# Patient Record
Sex: Male | Born: 1990 | Race: White | Hispanic: No | Marital: Married | Smoking: Never smoker
Health system: Southern US, Community
[De-identification: ages and names within clinical notes are randomized; demographics above are authoritative.]

## PROBLEM LIST (undated history)

## (undated) DIAGNOSIS — N63 Unspecified lump in unspecified breast: Secondary | ICD-10-CM

## (undated) DIAGNOSIS — J3089 Other allergic rhinitis: Secondary | ICD-10-CM

## (undated) DIAGNOSIS — K219 Gastro-esophageal reflux disease without esophagitis: Secondary | ICD-10-CM

## (undated) HISTORY — PX: TONSILLECTOMY: SUR1361

## (undated) HISTORY — DX: Gastro-esophageal reflux disease without esophagitis: K21.9

---

## 2013-06-29 DIAGNOSIS — S62308A Unspecified fracture of other metacarpal bone, initial encounter for closed fracture: Secondary | ICD-10-CM | POA: Insufficient documentation

## 2015-01-21 ENCOUNTER — Ambulatory Visit
Admission: RE | Admit: 2015-01-21 | Discharge: 2015-01-21 | Disposition: A | Discharge status: Home / Self Care | Payer: BLUE CROSS/BLUE SHIELD | Source: Ambulatory Visit | Attending: Physician Assistant | Admitting: Physician Assistant

## 2015-01-21 ENCOUNTER — Other Ambulatory Visit: Payer: Self-pay | Admitting: Physician Assistant

## 2015-01-21 DIAGNOSIS — S67192A Crushing injury of right middle finger, initial encounter: Secondary | ICD-10-CM | POA: Diagnosis present

## 2015-01-21 DIAGNOSIS — X58XXXA Exposure to other specified factors, initial encounter: Secondary | ICD-10-CM | POA: Diagnosis not present

## 2015-01-21 DIAGNOSIS — S62632A Displaced fracture of distal phalanx of right middle finger, initial encounter for closed fracture: Secondary | ICD-10-CM | POA: Insufficient documentation

## 2015-05-15 ENCOUNTER — Other Ambulatory Visit: Payer: Self-pay | Admitting: Surgery

## 2015-05-15 DIAGNOSIS — N63 Unspecified lump in unspecified breast: Secondary | ICD-10-CM

## 2015-05-21 ENCOUNTER — Ambulatory Visit
Admission: RE | Admit: 2015-05-21 | Discharge: 2015-05-21 | Disposition: A | Discharge status: Home / Self Care | Payer: BLUE CROSS/BLUE SHIELD | Source: Ambulatory Visit | Attending: Surgery | Admitting: Surgery

## 2015-05-21 ENCOUNTER — Other Ambulatory Visit: Payer: Self-pay | Admitting: Surgery

## 2015-05-21 ENCOUNTER — Ambulatory Visit (HOSPITAL_COMMUNITY)
Admission: RE | Admit: 2015-05-21 | Discharge: 2015-05-21 | Disposition: A | Discharge status: Home / Self Care | Payer: BLUE CROSS/BLUE SHIELD | Source: Ambulatory Visit | Attending: Surgery | Admitting: Surgery

## 2015-05-21 DIAGNOSIS — Z807 Family history of other malignant neoplasms of lymphoid, hematopoietic and related tissues: Secondary | ICD-10-CM | POA: Insufficient documentation

## 2015-05-21 DIAGNOSIS — N63 Unspecified lump in unspecified breast: Secondary | ICD-10-CM

## 2015-05-21 DIAGNOSIS — I889 Nonspecific lymphadenitis, unspecified: Secondary | ICD-10-CM | POA: Diagnosis not present

## 2015-05-21 HISTORY — PX: BREAST BIOPSY: SHX20

## 2015-05-21 HISTORY — DX: Unspecified lump in unspecified breast: N63.0

## 2015-05-23 LAB — SURGICAL PATHOLOGY

## 2015-05-27 ENCOUNTER — Other Ambulatory Visit: Payer: Self-pay

## 2015-05-27 ENCOUNTER — Ambulatory Visit: Payer: BLUE CROSS/BLUE SHIELD

## 2015-07-26 ENCOUNTER — Encounter: Payer: Self-pay | Admitting: Emergency Medicine

## 2015-07-26 ENCOUNTER — Ambulatory Visit
Admission: EM | Admit: 2015-07-26 | Discharge: 2015-07-26 | Disposition: A | Discharge status: Home / Self Care | Payer: Worker's Compensation | Attending: Family Medicine | Admitting: Family Medicine

## 2015-07-26 ENCOUNTER — Ambulatory Visit (INDEPENDENT_AMBULATORY_CARE_PROVIDER_SITE_OTHER): Payer: Worker's Compensation

## 2015-07-26 ENCOUNTER — Ambulatory Visit
Admit: 2015-07-26 | Discharge: 2015-07-26 | Disposition: A | Discharge status: Home / Self Care | Payer: Worker's Compensation | Attending: Family Medicine | Admitting: Family Medicine

## 2015-07-26 DIAGNOSIS — S51811D Laceration without foreign body of right forearm, subsequent encounter: Secondary | ICD-10-CM | POA: Diagnosis not present

## 2015-07-26 DIAGNOSIS — S51811A Laceration without foreign body of right forearm, initial encounter: Secondary | ICD-10-CM | POA: Diagnosis not present

## 2015-07-26 DIAGNOSIS — M795 Residual foreign body in soft tissue: Secondary | ICD-10-CM | POA: Diagnosis not present

## 2015-07-26 MED ORDER — CEPHALEXIN 500 MG PO CAPS: 500.0000 mg | ORAL_CAPSULE | Freq: Four times a day (QID) | ORAL | Status: AC

## 2015-07-26 MED ORDER — TETANUS-DIPHTH-ACELL PERTUSSIS 5-2.5-18.5 LF-MCG/0.5 IM SUSP
0.5000 mL | Freq: Once | INTRAMUSCULAR | Status: AC
  Administered 2015-07-26: 0.5 mL via INTRAMUSCULAR

## 2015-07-26 NOTE — ED Notes (Signed)
Pt presents with right arm laceration, cut on come glass today at work. Dressing in place bleeding controlled. Cms intact.

## 2015-07-26 NOTE — ED Notes (Signed)
Ct scan approved verbally by Norval MortonEsther Bennett, HR Dir. At [facility] [place]. CT scheduled for 430 today.

## 2015-07-26 NOTE — ED Provider Notes (Signed)
[facility]  ____________________________________________  Time seen: Approximately 2:56 PM  I have reviewed the triage vital signs and the nursing notes.   HISTORY  Chief Complaint Extremity Laceration   HPI Roberto Barnes is a 25 y.o. male presents with complaint of laceration to right forearm sustained approximately 2 hours prior to arrival. 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 Patient states that his coworker that was seen to find him had lifted a sheet of glass and that the wind blew, call the glass and cause it to fall. Patient reports that as the glass fell and hit against his right forearm on the way down and caused a laceration. Patient states that he has very mild pain directly at laceration site. Denies other pain. Denies numbness or tingling sensation. Denies decreased range of motion.  Patient reports is right-hand dominant. Denies fall to the ground. Denies head injury or loss consciousness. Denies other pain or injury. Patient reports that he thinks his tetanus immunization is up-to-date but states that he will discuss this with his wife.   Past Medical History  Diagnosis Date  . Breast mass     left breast mass x1 month    Patient Active Problem List   Diagnosis Date Noted  . Foreign body (FB) in soft tissue 07/26/2015    Past Surgical History  Procedure Laterality Date  . Breast biopsy Left 05/21/2015    path pending    Current Outpatient Rx  Name  Route  Sig  Dispense  Refill  . cephALEXin (KEFLEX) 500 MG capsule   Oral   Take 1 capsule (500 mg total) by mouth 4 (four) times daily.   28 capsule   0     Allergies Review of patient's allergies indicates no known allergies.  No family history on file.  Social History Social History  Substance Use Topics  . Smoking status: Never Smoker   . Smokeless tobacco: None  . Alcohol Use: Yes    Review of  Systems Constitutional: No fever/chills Eyes: No visual changes. ENT: No sore throat. Cardiovascular: Denies chest pain. Respiratory: Denies shortness of breath. Gastrointestinal: No abdominal pain.  No nausea, no vomiting.  No diarrhea.  No constipation. Genitourinary: Negative for dysuria. Musculoskeletal: Negative for back pain. Skin: Negative for rash.Right forearm laceration. Neurological: Negative for headaches, focal weakness or numbness.  10-point ROS otherwise negative.  ____________________________________________   PHYSICAL EXAM:  VITAL SIGNS: ED Triage Vitals  Enc Vitals Group     BP 07/26/15 1226 147/108 mmHg     Pulse Rate 07/26/15 1226 93     Resp 07/26/15 1226 18     Temp 07/26/15 1226 97.8 F (36.6 C)     Temp Source 07/26/15 1226 Oral     SpO2 07/26/15 1226 98 %     Weight 07/26/15 1226 340 lb (154.223 kg)     Height 07/26/15 1226  (1.753 m)     Head Cir --      Peak Flow --      Pain Score 07/26/15 1229 1     Pain Loc --      Pain Edu? --      Excl. in GC? --    Today's Vitals   07/26/15 1226 07/26/15 1229 07/26/15 1836  BP: 147/108  135/99  Pulse: 93  74  Temp: 97.8 F (36.6 C)    TempSrc: Oral  Resp: 18  20  Height:  (1.753 m)    Weight: 340 lb (154.223 kg)    SpO2: 98%  100%  PainSc:  1       Constitutional: Alert and oriented. Well appearing and in no acute distress. Eyes: Conjunctivae are normal. PERRL. EOMI. Head: Atraumatic.   Nose: No congestion/rhinnorhea.  Mouth/Throat: Mucous membranes are moist.   Neck: No stridor.  No cervical spine tenderness to palpation. Hematological/Lymphatic/Immunilogical: No cervical lymphadenopathy. Cardiovascular: Normal rate, regular rhythm. Grossly normal heart sounds.  Good peripheral circulation. Respiratory: Normal respiratory effort.  No retractions. Lungs CTAB. Gastrointestinal: Soft and nontender. Musculoskeletal: No lower or upper extremity tenderness nor edema. Bilateral hand  grips strong and equal. Bilateral distal radial pulses equal and easily palpable. Right hand capillary refill less than 2 seconds to all right and distal fingers. No motor or tendon deficits to right hand or right arm. Full range of motion to right upper extremity. No pain to right arm with resisted wrist extension, mild pain at laceration site with resisted wrist flexion.Sensation intact to bilateral upper extremities.  Neurologic:  Normal speech and language. No gross focal neurologic deficits are appreciated. No gait instability. Skin:  Skin is warm, dry and intact. No rash noted. Except: 6cm linear laceration to the lower aspect of right forearm mid to distal third of forearm,no arterial bleed, no foreign bodies visualized, mild active bleeding, no tendon visualized, full range of motion, see musculoskeletal above, no surrounding erythema, no exudate or discharge. Wound appears clean. Psychiatric: Mood and affect are normal. Speech and behavior are normal.  ____________________________________________   LABS (all labs ordered are listed, but only abnormal results are displayed)  Labs Reviewed - No data to display ____________________________________________  RADIOLOGY  EXAM: CT OF THE RIGHT FOREARM WITHOUT CONTRAST  TECHNIQUE: Multidetector CT imaging was performed according to the standard protocol. Multiplanar CT image reconstructions were also generated.  COMPARISON: None.  FINDINGS: There is no acute fracture or dislocation. There is no periosteal reaction or bone destruction. There is no lytic or sclerotic osseous lesion.  There is no focal fluid collection or hematoma. There is a soft tissue laceration along the anterolateral aspect of the mid right forearm. There is no radiopaque foreign body.  The muscles are normal. There is no muscle atrophy. There is no intramuscular fluid collection or hematoma.  IMPRESSION: 1. No acute osseous injury of the right forearm. 2.  Soft tissue laceration anterolaterally in the mid right forearm without a radiopaque foreign body.   Electronically Signed By: Elige Ko On: 07/26/2015 17:22          DG Forearm Right (Final result) Result time: 07/26/15 13:56:45   Final result by Rad Results In Interface (07/26/15 13:56:45)   Narrative:   CLINICAL DATA: Glass laceration distal forearm  EXAM: RIGHT FOREARM - 2 VIEW  COMPARISON: None.  FINDINGS: Two views of the right forearm submitted. No acute fracture or subluxation. On lateral view there is a triangular skin laceration and a soft tissue defect consistent with provided history of laceration. Within soft tissue at this level there is a vague density measures about 6 mm. A foreign body cannot be excluded. Clinical correlation is necessary.  IMPRESSION: No acute fracture or subluxation. On lateral view there is a triangular skin laceration and a soft tissue defect consistent with provided history of laceration. Within soft tissue at this level there is a vague density measures about 6 mm. A foreign body cannot be excluded. Clinical correlation is  necessary. These results were called by telephone at the time of interpretation on 07/26/2015 at 1:55 pm to Dr. Renford Dills , who verbally acknowledged these results.   Electronically Signed By: Natasha Mead M.D. On: 07/26/2015 13:56   I, Renford Dills, personally viewed and evaluated these images (plain radiographs) as part of my medical decision making, as well as reviewing the written report by the radiologist.  ____________________________________________   PROCEDURES  Procedure(s) performed:  Procedure(s) performed:  Procedure explained and verbal consent obtained. Consent: Verbal consent obtained. Written consent not obtained. Risks and benefits: risks, benefits and alternatives were discussed Patient identity confirmed: verbally with patient and hospital-assigned identification  number  Consent given by: patient   Laceration Repair Location: Right forearm Length: 6 centimeters Foreign bodies: no foreign bodies found with superficial wound exploration, visualization or palpation Tendon involvement: none Nerve involvement: none Preparation: Patient was prepped and draped in the usual sterile fashion. Anesthesia with 1% Lidocaine 10 mls Irrigation solution: saline and Betadine.  Irrigation method: jet lavage Amount of cleaning: copious Repaired with 5-0 nylon  Number of sutures:8  Technique: simple interrupted  Approximation: loose Patient tolerate well. Wound well approximated post repair.  Antibiotic ointment and dressing applied.  Wound care instructions provided.  Observe for any signs of infection or other problems.       INITIAL IMPRESSION / ASSESSMENT AND PLAN / ED COURSE  Pertinent labs & imaging results that were available during my care of the patient were reviewed by me and considered in my medical decision making (see chart for details).  Very well-appearing patient. No acute distress. 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 Mild pain directly around laceration site. Denies other pain or injury. Post lidocaine anesthesia, wound site nontender without any other pain to right forearm. Neurovascular intact. No tendon deficit. No tendon laceration visualized. Will evaluate x-ray.  1400: Right forearm x-ray results discussed with Dr. Ruffin Frederick radiologist by telephone and reviewed by myself, no fracture or dislocation or subluxation; Lateral view including triangular skin laceration with a soft tissue defect consistent with provided history of laceration, within soft tissue level there is a vague density measuring approximate 6 mm and per radiologist's foreign body cannot be excluded.   Right forearm laceration anesthestized, and wound  superficially explored without findings of foreign body. Patient tolerated well. Nontender. Further discussed this finding with radiologist and will evaluate right forearm by CT imaging for further evaluation of foreign body potential as per x-ray results.  CT right forearm per radiologist no acute osseous injury of the right forearm, soft tissue laceration anterior laterally in the mid right forearm without radiopaque foreign body. Discussed findings with patient. Laceration repaired. Patient tolerated well.  Discussed wound management at home including cleaning daily and topical antibiotics. Will place patient on oral cephalexin. Patient denies need for pain medication. Patient states that he has this weekend off of work. Encouraged keeping it covered one at work. Return to urgent care in 7-10 days for suture removal. Counseled regarding return sooner for redness, drainage, signs of infection, numbness, tingling sensation, pain or decreased range motion. Tetanus immunization updated.  Discussed follow up with Primary care physician this week. Discussed follow up and return parameters including no resolution or any worsening concerns. Patient verbalized understanding and agreed to plan.   ____________________________________________   FINAL CLINICAL IMPRESSION(S) / ED DIAGNOSES  Final diagnoses:  concern for, NO foreign  body on CT  Forearm laceration, right, initial encounter      Note: This dictation was prepared with Dragon dictation along with smaller phrase technology. Any transcriptional errors that result from this process are unintentional.    Renford DillsLindsey Jordane Hisle, NP 07/26/15 1903

## 2015-07-26 NOTE — Discharge Instructions (Signed)
Take medication as prescribed. Keep clean. Clean daily with soap and water. Apply thin layer of topical antibiotic ointment daily as discussed. Return to Urgent care in 7-10 days for suture removal.    Follow up with your primary care physician this week as needed. Return to Urgent care sooner for redness, drainage, swelling, pain, numbness, new or worsening concerns.    Laceration Care, Adult A laceration is a cut that goes through all of the layers of the skin and into the tissue that is right under the skin. Some lacerations heal on their own. Others need to be closed with stitches (sutures), staples, skin adhesive strips, or skin glue. Proper laceration care minimizes the risk of infection and helps the laceration to heal better. HOW TO CARE FOR YOUR LACERATION If sutures or staples were used:  Keep the wound clean and dry.  If you were given a bandage (dressing), you should change it at least one time per day or as told by your health care provider. You should also change it if it becomes wet or dirty.  Keep the wound completely dry for the first 24 hours or as told by your health care provider. After that time, you may shower or bathe. However, make sure that the wound is not soaked in water until after the sutures or staples have been removed.  Clean the wound one time each day or as told by your health care provider:  Wash the wound with soap and water.  Rinse the wound with water to remove all soap.  Pat the wound dry with a clean towel. Do not rub the wound.  After cleaning the wound, apply a thin layer of antibiotic ointmentas told by your health care provider. This will help to prevent infection and keep the dressing from sticking to the wound.  Have the sutures or staples removed as told by your health care provider. If skin adhesive strips were used:  Keep the wound clean and dry.  If you were given a bandage (dressing), you should change it at least one time per day or  as told by your health care provider. You should also change it if it becomes dirty or wet.  Do not get the skin adhesive strips wet. You may shower or bathe, but be careful to keep the wound dry.  If the wound gets wet, pat it dry with a clean towel. Do not rub the wound.  Skin adhesive strips fall off on their own. You may trim the strips as the wound heals. Do not remove skin adhesive strips that are still stuck to the wound. They will fall off in time. If skin glue was used:  Try to keep the wound dry, but you may briefly wet it in the shower or bath. Do not soak the wound in water, such as by swimming.  After you have showered or bathed, gently pat the wound dry with a clean towel. Do not rub the wound.  Do not do any activities that will make you sweat heavily until the skin glue has fallen off on its own.  Do not apply liquid, cream, or ointment medicine to the wound while the skin glue is in place. Using those may loosen the film before the wound has healed.  If you were given a bandage (dressing), you should change it at least one time per day or as told by your health care provider. You should also change it if it becomes dirty or wet.  If  a dressing is placed over the wound, be careful not to apply tape directly over the skin glue. Doing that may cause the glue to be pulled off before the wound has healed.  Do not pick at the glue. The skin glue usually remains in place for 5-10 days, then it falls off of the skin. General Instructions  Take over-the-counter and prescription medicines only as told by your health care provider.  If you were prescribed an antibiotic medicine or ointment, take or apply it as told by your doctor. Do not stop using it even if your condition improves.  To help prevent scarring, make sure to cover your wound with sunscreen whenever you are outside after stitches are removed, after adhesive strips are removed, or when glue remains in place and the  wound is healed. Make sure to wear a sunscreen of at least 30 SPF.  Do not scratch or pick at the wound.  Keep all follow-up visits as told by your health care provider. This is important.  Check your wound every day for signs of infection. Watch for:  Redness, swelling, or pain.  Fluid, blood, or pus.  Raise (elevate) the injured area above the level of your heart while you are sitting or lying down, if possible. SEEK MEDICAL CARE IF:  You received a tetanus shot and you have swelling, severe pain, redness, or bleeding at the injection site.  You have a fever.  A wound that was closed breaks open.  You notice a bad smell coming from your wound or your dressing.  You notice something coming out of the wound, such as wood or glass.  Your pain is not controlled with medicine.  You have increased redness, swelling, or pain at the site of your wound.  You have fluid, blood, or pus coming from your wound.  You notice a change in the color of your skin near your wound.  You need to change the dressing frequently due to fluid, blood, or pus draining from the wound.  You develop a new rash.  You develop numbness around the wound. SEEK IMMEDIATE MEDICAL CARE IF:  You develop severe swelling around the wound.  Your pain suddenly increases and is severe.  You develop painful lumps near the wound or on skin that is anywhere on your body.  You have a red streak going away from your wound.  The wound is on your hand or foot and you cannot properly move a finger or toe.  The wound is on your hand or foot and you notice that your fingers or toes look pale or bluish.   This information is not intended to replace advice given to you by your health care provider. Make sure you discuss any questions you have with your health care provider.   Document Released: 04/06/2005 Document Revised: 08/21/2014 Document Reviewed: 04/02/2014 Elsevier Interactive Patient Education [employer]2016 [employer]

## 2015-07-26 NOTE — ED Notes (Signed)
Bacitracin ointment applied to right forearm over sutures with dressing applied. Pt to return in 7-10 days for suture removal.

## 2015-07-26 NOTE — ED Notes (Signed)
Per provider, pt is to return to clinic to get his tetanus injection.

## 2015-08-05 ENCOUNTER — Ambulatory Visit
Admission: EM | Admit: 2015-08-05 | Discharge: 2015-08-05 | Disposition: A | Discharge status: Home / Self Care | Payer: Worker's Compensation | Attending: Family Medicine | Admitting: Family Medicine

## 2015-08-05 ENCOUNTER — Encounter: Payer: Self-pay | Admitting: *Deleted

## 2015-08-05 DIAGNOSIS — S51811D Laceration without foreign body of right forearm, subsequent encounter: Secondary | ICD-10-CM

## 2015-08-05 DIAGNOSIS — Z4802 Encounter for removal of sutures: Secondary | ICD-10-CM | POA: Diagnosis not present

## 2015-08-05 NOTE — Discharge Instructions (Signed)

## 2015-08-05 NOTE — ED Provider Notes (Signed)
[facility]  ____________________________________________  Time seen: Approximately 9:12 AM  I have reviewed the triage vital signs and the nursing notes.   HISTORY  Chief Complaint Suture / Staple Removal  HPI Roberto Barnes is a 25 y.o. male presents for right forearm suture removal. Injury occurred 10 days ago from work when a piece of glass fell and cut right forearm. Patient reports area healing well without pain or complications. Denies redness, drainage, fevers or pain. Denies decreased range of motion. Denies other complaints.    Past Medical History  Diagnosis Date  . Breast mass     left breast mass x1 month    Patient Active Problem List   Diagnosis Date Noted  .    none  Past Surgical History  Procedure Laterality Date  . Breast biopsy Left 05/21/2015    path pending    No current outpatient prescriptions on file.  Allergies Review of patient's allergies indicates no known allergies.  History reviewed. No pertinent family history.  Social History Social History  Substance Use Topics  . Smoking status: Never Smoker   . Smokeless tobacco: None  . Alcohol Use: Yes    Review of Systems Constitutional: No fever/chills Eyes: No visual changes. ENT: No sore throat. Cardiovascular: Denies chest pain. Respiratory: Denies shortness of breath. Gastrointestinal: No abdominal pain.  No nausea, no vomiting.  No diarrhea.  No constipation. Genitourinary: Negative for dysuria. Musculoskeletal: Negative for back pain. Skin: Negative for rash. Neurological: Negative for headaches, focal weakness or numbness.  10-point ROS otherwise negative.  ____________________________________________   PHYSICAL EXAM:  VITAL SIGNS: ED Triage Vitals  Enc Vitals Group     BP 08/05/15 0843 133/88 mmHg     Pulse Rate 08/05/15 0843 76     Resp 08/05/15 0843 16     Temp 08/05/15 0843 98 F (36.7 C)     Temp Source 08/05/15 0843 Oral     SpO2 08/05/15 0843 98  %     Weight 08/05/15 0843 335 lb (151.955 kg)     Height 08/05/15 0843 5\' 9"  (1.753 m)     Head Cir --      Peak Flow --      Pain Score --      Pain Loc --      Pain Edu? --      Excl. in GC? --     Constitutional: Alert and oriented. Well appearing and in no acute distress. Eyes: Conjunctivae are normal. PERRL. EOMI. Head: Atraumatic.  Mouth/Throat: Mucous membranes are moist.   Neck: No stridor.  No cervical spine tenderness to palpation. Cardiovascular: Normal rate, regular rhythm. Grossly normal heart sounds.  Good peripheral circulation. Respiratory: Normal respiratory effort.  No retractions. Lungs CTAB. Gastrointestinal: Soft and nontender.  Musculoskeletal: No lower or upper extremity tenderness nor edema.  Neurologic:  Normal speech and language. No gross focal neurologic deficits are appreciated. No gait instability. Skin:  Skin is warm, dry and intact. No rash noted. Right forearm laceration healing, well approximated with x 8 sutures in place, no erythema, no exudate, no drainage. Full ROM. Bilateral hand grips equal. Right upper extremity with full ROM without pain. Right upper extremity no motor, tendon or sensation deficits.  Psychiatric: Mood and affect are normal. Speech and behavior are normal.  ____________________________________________   LABS (all labs ordered are listed, but only abnormal results are displayed)  Labs Reviewed - No data to display ____________________________________________ PROCEDURES  Procedure(s) performed: Sutures removed by RN, wound  well approximated.  ______________________________________   INITIAL IMPRESSION / ASSESSMENT AND PLAN / ED COURSE  Pertinent labs & imaging results that were available during my care of the patient were reviewed by me and considered in my medical decision making (see chart for details).  Patient presents for suture removal. The wound is well healed without signs of infection.  The sutures are  removed. Wound care and activity instructions given. Return prn. Note given to return to work without restrictions.   Discussed follow up with Primary care physician this week as needed. Discussed follow up and return parameters including no resolution or any worsening concerns. Patient verbalized understanding and agreed to plan.   ____________________________________________  FINAL CLINICAL IMPRESSION(S) / ED DIAGNOSES  Final diagnoses:  Forearm laceration, right, subsequent encounter  Visit for suture removal      Note: This dictation was prepared with Dragon dictation along with smaller phrase technology. Any transcriptional errors that result from this process are unintentional.    Renford Dills, NP 08/05/15 [phone]

## 2015-08-05 NOTE — ED Notes (Signed)
Suture removal, right forearm.

## 2016-02-18 ENCOUNTER — Ambulatory Visit
Admission: EM | Admit: 2016-02-18 | Discharge: 2016-02-18 | Disposition: A | Discharge status: Home / Self Care | Payer: 59 | Attending: Family Medicine | Admitting: Family Medicine

## 2016-02-18 ENCOUNTER — Encounter: Payer: Self-pay | Admitting: *Deleted

## 2016-02-18 DIAGNOSIS — J069 Acute upper respiratory infection, unspecified: Secondary | ICD-10-CM

## 2016-02-18 LAB — RAPID STREP SCREEN (MED CTR MEBANE ONLY): STREPTOCOCCUS, GROUP A SCREEN (DIRECT): NEGATIVE

## 2016-02-18 MED ORDER — FLUTICASONE PROPIONATE 50 MCG/ACT NA SUSP: 2.0000 | Freq: Every day | NASAL | 0 refills | Status: DC

## 2016-02-18 MED ORDER — HYDROCOD POLST-CPM POLST ER 10-8 MG/5ML PO SUER: 5.0000 mL | Freq: Two times a day (BID) | ORAL | 0 refills | Status: DC

## 2016-02-18 NOTE — ED Triage Notes (Signed)
Productive cough-yellow, sore throat, fever, headache, diarrhea, since Sunday.

## 2016-02-18 NOTE — ED Provider Notes (Signed)
CSN: [id number]     Arrival date & time 02/18/16  0905 History   First MD Initiated Contact with Patient 02/18/16 [phone]     Chief Complaint  Patient presents with  . Sore Throat  . Headache  . Cough  . Diarrhea   (Consider location/radiation/quality/duration/timing/severity/associated sxs/prior Treatment) HPI  25 year old male who presents with a 2 day history of sore throat headache mildly productive cough and diarrhea which began yesterday. He denies any nausea or vomiting does not have any fever or chills. Been using over-the-counter preparations may have caused his diarrhea. Vital signs today shows the temperature to be 98.8 blood pressure 137/84 O2 sats on room air is 99%.      Past Medical History:  Diagnosis Date  . Breast mass    left breast mass x1 month   Past Surgical History:  Procedure Laterality Date  . BREAST BIOPSY Left 05/21/2015   path pending  . TONSILLECTOMY     History reviewed. No pertinent family history. Social History  Substance Use Topics  . Smoking status: Never Smoker  . Smokeless tobacco: Current User  . Alcohol use Yes    Review of Systems  Constitutional: Positive for activity change and fever. Negative for chills and fatigue.  HENT: Positive for congestion, postnasal drip, rhinorrhea, sinus pressure and sore throat.   Respiratory: Positive for cough. Negative for shortness of breath, wheezing and stridor.   Gastrointestinal: Positive for diarrhea. Negative for abdominal pain, blood in stool, nausea and vomiting.  All other systems reviewed and are negative.   Allergies  Review of patient's allergies indicates no known allergies.  Home Medications   Prior to Admission medications   Medication Sig Start Date End Date Taking? Authorizing Provider  chlorpheniramine-HYDROcodone (TUSSIONEX PENNKINETIC ER) 10-8 MG/5ML SUER Take 5 mLs by mouth 2 (two) times daily. 02/18/16   Lutricia FeilWilliam P Vartan Kerins, PA-C  fluticasone (FLONASE) 50 MCG/ACT nasal  spray Place 2 sprays into both nostrils daily. 02/18/16   Lutricia FeilWilliam P Adin Laker, PA-C   Meds Ordered and Administered this Visit  Medications - No data to display  BP 137/84 (BP Location: Left Arm)   Pulse 83   Temp 98.8 F (37.1 C) (Oral)   Resp 16   Ht 5\' 9"  (1.753 m)   Wt (!) 346 lb (156.9 kg)   SpO2 99%   BMI 51.10 kg/m  No data found.   Physical Exam  Constitutional: He is oriented to person, place, and time. He appears well-developed and well-nourished. No distress.  HENT:  Head: Normocephalic and atraumatic.  Left TM is obscured due to cerumen in canal occluding viewing. He shouldn't does not have any tenderness to percussion over the sinuses. There is no anterior cervical lymphadenopathy appreciated.  Eyes: EOM are normal. Pupils are equal, round, and reactive to light.  Neck: Normal range of motion. Neck supple.  Pulmonary/Chest: Effort normal and breath sounds normal. No respiratory distress. He has no wheezes. He has no rales.  Abdominal: Soft. Bowel sounds are normal.  Musculoskeletal: Normal range of motion.  Neurological: He is alert and oriented to person, place, and time.  Skin: Skin is warm and dry. He is not diaphoretic.  Psychiatric: He has a normal mood and affect. His behavior is normal. Judgment and thought content normal.  Nursing note and vitals reviewed.   Urgent Care Course   Clinical Course    Procedures (including critical care time)  Labs Review Labs Reviewed  RAPID STRE[facility] SCREEN (NOT [facility])  CULTURE, GROUP  A STREP [facility])    Imaging Review No results found.   Visual Acuity Review  Right Eye Distance:   Left Eye Distance:   Bilateral Distance:    Right Eye Near:   Left Eye Near:    Bilateral Near:         MDM   1. Upper respiratory tract infection, unspecified type    New Prescriptions   CHLORPHENIRAMINE-HYDROCODONE (TUSSIONEX PENNKINETIC ER) 10-8 MG/5ML SUER    Take 5 mLs by mouth 2 (two) times daily.   FLUTICASONE  (FLONASE) 50 MCG/ACT NASAL SPRAY    Place 2 sprays into both nostrils daily.  Plan: 1. Test/x-ray results and diagnosis reviewed with patient 2. rx as per orders; risks, benefits, potential side effects reviewed with patient 3. Recommend supportive treatment with Rest so far Flonase daily for one month. I've Illnesses likely a viral illness and does not require antibiotics. The pharyngeal swab should be resulted in 48 hours. He will be notified and treated appropriately. Is not improving he should follow-up with his primary care physician. 4. F/u prn if symptoms worsen or don't improve     Lutricia FeilWilliam P Leilanee Righetti, PA-C 02/18/16 1002

## 2016-02-21 LAB — CULTURE, GROUP A STREP (THRC)

## 2016-02-22 ENCOUNTER — Telehealth: Payer: Self-pay | Admitting: Emergency Medicine

## 2016-02-22 NOTE — Telephone Encounter (Signed)
Patient notified of his throat culture result.  Patient states that he is feeling better. Patient instructed to follow-up here or with PCP if his symptoms worsen.  Patient verbalized understanding.

## 2016-04-21 ENCOUNTER — Encounter: Payer: Self-pay | Admitting: Family Medicine

## 2016-04-21 ENCOUNTER — Ambulatory Visit (INDEPENDENT_AMBULATORY_CARE_PROVIDER_SITE_OTHER): Payer: 59 | Admitting: Family Medicine

## 2016-04-21 VITALS — BP 120/70 | HR 80 | Ht 69.0 in | Wt 350.0 lb

## 2016-04-21 DIAGNOSIS — N342 Other urethritis: Secondary | ICD-10-CM

## 2016-04-21 LAB — POCT URINALYSIS DIPSTICK
BILIRUBIN UA: NEGATIVE
GLUCOSE UA: NEGATIVE
Ketones, UA: NEGATIVE
Leukocytes, UA: NEGATIVE
NITRITE UA: NEGATIVE
Protein, UA: NEGATIVE
SPEC GRAV UA: 1.01
Urobilinogen, UA: 0.2
pH, UA: 5

## 2016-04-21 MED ORDER — SULFAMETHOXAZOLE-TRIMETHOPRIM 800-160 MG PO TABS: 1.0000 | ORAL_TABLET | Freq: Two times a day (BID) | ORAL | 0 refills | Status: DC

## 2016-04-21 MED ORDER — FLUCONAZOLE 150 MG PO TABS: 150.0000 mg | ORAL_TABLET | Freq: Once | ORAL | 0 refills | Status: AC

## 2016-04-21 NOTE — Progress Notes (Signed)
Name: Donathan Buller   MRN: [id number]    DOB: 19-Sep-1990   Date:04/21/2016       Progress Note  Subjective  Chief Complaint  Chief Complaint  Patient presents with  . Urinary Tract Infection    constant urge to go urinate and a burning sensation with tinge of blood in urine. Been going on x 3 days    Urinary Tract Infection   This is a new problem. The current episode started in the past 7 days. The problem occurs every urination. The problem has been gradually worsening. The quality of the pain is described as burning. The pain is at a severity of 4/10. The pain is moderate. There has been no fever. The fever has been present for 1 - 2 days. Associated symptoms include a discharge, frequency, hematuria and urgency. Pertinent negatives include no chills, flank pain, hesitancy, nausea, sweats or vomiting. He has tried increased fluids for the symptoms. The treatment provided no relief.    No problem-specific Assessment & Plan notes found for this encounter.   Past Medical History:  Diagnosis Date  . Breast mass    left breast mass x1 month  . GERD (gastroesophageal reflux disease)     Past Surgical History:  Procedure Laterality Date  . BREAST BIOPSY Left 05/21/2015   path pending  . TONSILLECTOMY      History reviewed. No pertinent family history.  Social History   Social History  . Marital status: Married    Spouse name: N/A  . Number of children: N/A  . Years of education: N/A   Occupational History  . Not on file.   Social History Main Topics  . Smoking status: Never Smoker  . Smokeless tobacco: Current User  . Alcohol use Yes  . Drug use: Unknown  . Sexual activity: Yes   Other Topics Concern  . Not on file   Social History Narrative  . No narrative on file    No Known Allergies   Review of Systems  Constitutional: Negative for chills, fever, malaise/fatigue and weight loss.  HENT: Negative for ear discharge, ear pain and sore throat.   Eyes:  Negative for blurred vision.  Respiratory: Negative for cough, sputum production, shortness of breath and wheezing.   Cardiovascular: Negative for chest pain, palpitations and leg swelling.  Gastrointestinal: Negative for abdominal pain, blood in stool, constipation, diarrhea, heartburn, melena, nausea and vomiting.  Genitourinary: Positive for frequency, hematuria and urgency. Negative for dysuria, flank pain and hesitancy.  Musculoskeletal: Negative for back pain, joint pain, myalgias and neck pain.  Skin: Negative for rash.  Neurological: Negative for dizziness, tingling, sensory change, focal weakness and headaches.  Endo/Heme/Allergies: Negative for environmental allergies and polydipsia. Does not bruise/bleed easily.  Psychiatric/Behavioral: Negative for depression and suicidal ideas. The patient is not nervous/anxious and does not have insomnia.      Objective  Vitals:   04/21/16 1511  BP: 120/70  Pulse: 80  Weight: (!) 350 lb (158.8 kg)  Height: 5\' 9"  (1.753 m)    Physical Exam  Constitutional: He is oriented to person, place, and time and well-developed, well-nourished, and in no distress.  HENT:  Head: Normocephalic.  Right Ear: External ear normal.  Left Ear: External ear normal.  Nose: Nose normal.  Mouth/Throat: Oropharynx is clear and moist.  Eyes: Conjunctivae and EOM are normal. Pupils are equal, round, and reactive to light. Right eye exhibits no discharge. Left eye exhibits no discharge. No scleral icterus.  Neck: Normal range  of motion. Neck supple. No JVD present. No tracheal deviation present. No thyromegaly present.  Cardiovascular: Normal rate, regular rhythm, normal heart sounds and intact distal pulses.  Exam reveals no gallop and no friction rub.   No murmur heard. Pulmonary/Chest: Breath sounds normal. No respiratory distress. He has no wheezes. He has no rales.  Abdominal: Soft. Bowel sounds are normal. He exhibits no mass. There is no  hepatosplenomegaly. There is no tenderness. There is no rebound, no guarding and no CVA tenderness.  Musculoskeletal: Normal range of motion. He exhibits no edema or tenderness.  Lymphadenopathy:    He has no cervical adenopathy.  Neurological: He is alert and oriented to person, place, and time. He has normal sensation, normal strength, normal reflexes and intact cranial nerves. No cranial nerve deficit.  Skin: Skin is warm. No rash noted.  Psychiatric: Mood and affect normal.  Nursing note and vitals reviewed.     Assessment & Plan  Problem List Items Addressed This Visit    None    Visit Diagnoses    Urethritis    -  Primary   Relevant Medications   sulfamethoxazole-trimethoprim (BACTRIM DS,SEPTRA DS) 800-160 MG tablet   fluconazole (DIFLUCAN) 150 MG tablet   Other Relevant Orders   POCT Urinalysis Dipstick (Completed)        Dr. Hayden Rasmusseneanna Jones [facility]  04/21/16

## 2016-12-16 ENCOUNTER — Encounter: Payer: Self-pay | Admitting: Emergency Medicine

## 2016-12-16 ENCOUNTER — Ambulatory Visit
Admission: EM | Admit: 2016-12-16 | Discharge: 2016-12-16 | Disposition: A | Discharge status: Home / Self Care | Payer: 59 | Attending: Family Medicine | Admitting: Family Medicine

## 2016-12-16 DIAGNOSIS — L237 Allergic contact dermatitis due to plants, except food: Secondary | ICD-10-CM | POA: Diagnosis not present

## 2016-12-16 MED ORDER — METHYLPREDNISOLONE SODIUM SUCC 125 MG IJ SOLR
125.0000 mg | Freq: Once | INTRAMUSCULAR | Status: AC
  Administered 2016-12-16: 125 mg via INTRAMUSCULAR

## 2016-12-16 MED ORDER — PREDNISONE 10 MG (21) PO TBPK: ORAL_TABLET | ORAL | 0 refills | Status: DC

## 2016-12-16 NOTE — ED Notes (Signed)
Patient shows no signs of adverse reaction to medication at this time.  

## 2016-12-16 NOTE — ED Triage Notes (Signed)
Patient c/o itchy rash from his knees down to ankles that started on Saturday.  Patient states that he was doing some yard work on Saturday.

## 2016-12-16 NOTE — ED Provider Notes (Signed)
[facility]    CSN: [id number] Arrival date & time: 12/16/16  1712     History   Chief Complaint Chief Complaint  Patient presents with  . Rash    HPI Roberto Barnes is a 26 y.o. male.   Patient is a 26 year old white male who states he was putting up a privacy fence in his yard on Saturday. Is wearing shorts notices coin-like lesion on his right lower leg he then stopped. On some long pants but apparently was too late since then he's had progressed rash both his lower legs itching peeling redness and inflammation present. Past medical history tonsillectomy and adenoidectomy according to his chart he's had breast biopsy. Habits he dips does not smoke. No pertinent family medical history relevant today's visit no known drug allergies. He's had fluctuations blood pressure states that taking any medication for his blood pressure.   The history is provided by the patient.  Rash  Location:  Leg Leg rash location:  L leg and R leg Quality: blistering, draining, itchiness, painful, redness and swelling   Pain details:    Quality:  Itching   Severity:  Moderate   Duration:  5 days   Timing:  Constant   Progression:  Worsening Severity:  Moderate Onset quality:  Sudden Duration:  5 days Timing:  Constant Progression:  Worsening Chronicity:  New Context: plant contact     Past Medical History:  Diagnosis Date  . Breast mass    left breast mass x1 month  . GERD (gastroesophageal reflux disease)     Patient Active Problem List   Diagnosis Date Noted  . Foreign body (FB) in soft tissue 07/26/2015    Past Surgical History:  Procedure Laterality Date  . BREAST BIOPSY Left 05/21/2015   path pending  . TONSILLECTOMY         Home Medications    Prior to Admission medications   Medication Sig Start Date End Date Taking? Authorizing Provider  predniSONE (STERAPRED UNI-PAK 21 TAB) 10 MG (21) TBPK tablet 6 tabs day 1 and 2, 5 tabs day 3 and 4, 4 tabs day 5 and  6, 3 tabs day 7 and 8, 2 tabs day 9 and 10, 1 tab day 11 and 12. Take orally 12/16/16   Hassan Rowan, MD  ranitidine (ZANTAC) 150 MG capsule Take 150 mg by mouth as needed for heartburn.    [provider]    Family History History reviewed. No pertinent family history.  Social History Social History  Substance Use Topics  . Smoking status: Never Smoker  . Smokeless tobacco: Current User  . Alcohol use Yes     Allergies   Patient has no known allergies.   Review of Systems Review of Systems  Skin: Positive for rash.  All other systems reviewed and are negative.    Physical Exam Triage Vital Signs ED Triage Vitals  Enc Vitals Group     BP 12/16/16 1752 (!) 155/89     Pulse Rate 12/16/16 1752 81     Resp 12/16/16 1752 16     Temp 12/16/16 1752 98.4 F (36.9 C)     Temp Source 12/16/16 1752 Oral     SpO2 12/16/16 1752 100 %     Weight 12/16/16 1750 (!) 335 lb (152 kg)     Height 12/16/16 1750 5\' 8"  (1.727 m)     Head Circumference --      Peak Flow --      Pain Score  12/16/16 1750 3     Pain Loc --      Pain Edu? --      Excl. in GC? --    No data found.   Updated Vital Signs BP (!) 155/89 (BP Location: Left Arm)   Pulse 81   Temp 98.4 F (36.9 C) (Oral)   Resp 16   Ht 5\' 8"  (1.727 m)   Wt (!) 335 lb (152 kg)   SpO2 100%   BMI 50.94 kg/m   Visual Acuity Right Eye Distance:   Left Eye Distance:   Bilateral Distance:    Right Eye Near:   Left Eye Near:    Bilateral Near:     Physical Exam  Constitutional: He is oriented to person, place, and time. He appears well-developed and well-nourished. No distress.  HENT:  Head: Normocephalic and atraumatic.  Eyes: Pupils are equal, round, and reactive to light.  Neck: Normal range of motion. Neck supple.  Pulmonary/Chest: Effort normal.  Musculoskeletal: He exhibits no tenderness.  Neurological: He is alert and oriented to person, place, and time.  Skin: Lesion and rash noted. He is not  diaphoretic. There is erythema.     Patient's contact dermatitis of both lower legs area of thickened swollen hyperemic  Psychiatric: He has a normal mood and affect.  Vitals reviewed.    UC Treatments / Results  Labs (all labs ordered are listed, but only abnormal results are displayed) Labs Reviewed - No data to display  EKG  EKG Interpretation None       Radiology No results found.  Procedures Procedures (including critical care time)  Medications Ordered in UC Medications  methylPREDNISolone sodium succinate (SOLU-MEDROL) 125 mg/2 mL injection 125 mg (not administered)     Initial Impression / Assessment and Plan / UC Course  I have reviewed the triage vital signs and the nursing notes.  Pertinent labs & imaging results that were available during my care of the patient were reviewed by me and considered in my medical decision making (see chart for details).   will Mr. 125 mg Solu-Medrol IM and place on 12 day course of prednisone orally. He is informed me; probable wearing his steel toe shoes, the swelling in his legs will give a work note for today and tomorrow as well.    Final Clinical Impressions(s) / UC Diagnoses   Final diagnoses:  Allergic contact dermatitis due to plants, except food    New Prescriptions New Prescriptions   PREDNISONE (STERAPRED UNI-PAK 21 TAB) 10 MG (21) TBPK TABLET    6 tabs day 1 and 2, 5 tabs day 3 and 4, 4 tabs day 5 and 6, 3 tabs day 7 and 8, 2 tabs day 9 and 10, 1 tab day 11 and 12. Take orally   Note: This dictation was prepared with Dragon dictation along with smaller phrase technology. Any transcriptional errors that result from this process are unintentional. Controlled Substance Prescriptions   [place] Controlled Substance Registry consulted? Not Applicable   Hassan Rowan, MD 12/16/16 [place] Fruit

## 2017-06-22 IMAGING — CT CT FOREARM*R* W/O CM
3 of 5 series · 13 of 29 positions shown, 15 images · non-contrast
Comparison: None.

CLINICAL DATA: Pt cut his arm with glass on the radial side.
Evaluate for foreign body.

EXAM:
CT OF THE RIGHT FOREARM WITHOUT CONTRAST
TECHNIQUE: Multidetector CT imaging was performed according to the standard
protocol. Multiplanar CT image reconstructions were also generated.

[Series 602: coronal bone · axial · 0.50mm/px · z∈[-269,-119]mm · 4 of 416 slices shown, 5 images]
[im 84/416  soft-tissue]
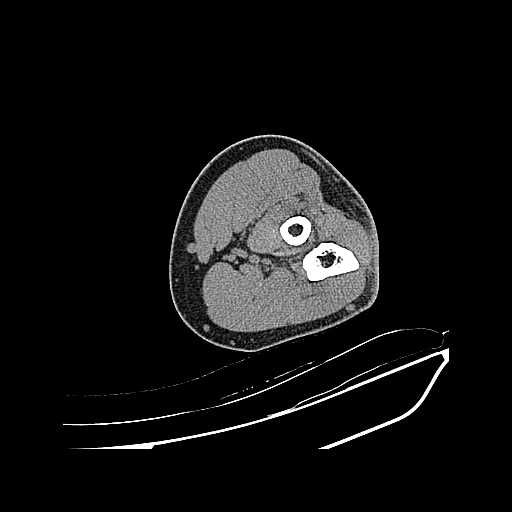
[im 84/416  bone]
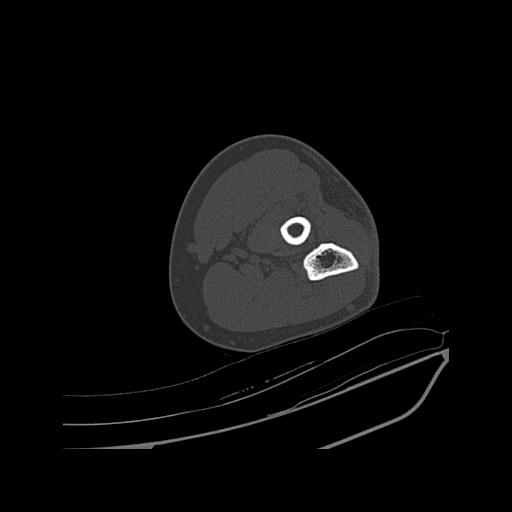
[im 167/416  bone]
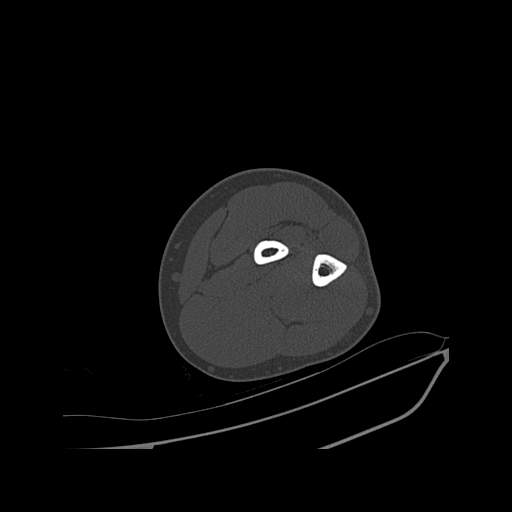
[im 250/416  bone]
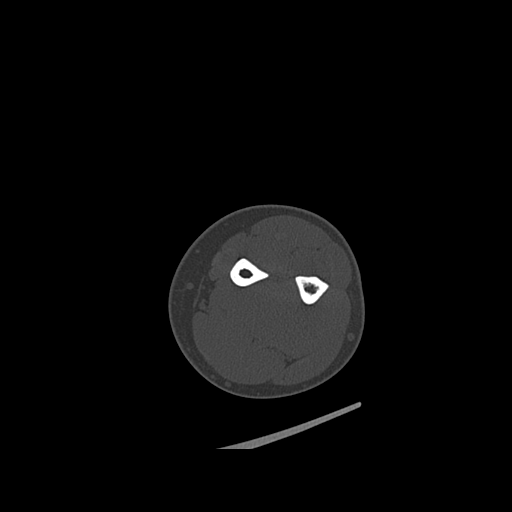
[im 333/416  bone]
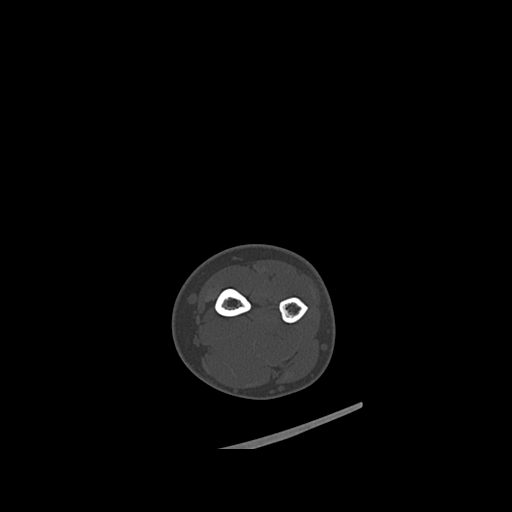

[Series 604: coronal soft tissue · axial · 0.50mm/px · z∈[-262,-116]mm · 4 of 403 slices shown]
[im 81/403  soft-tissue]
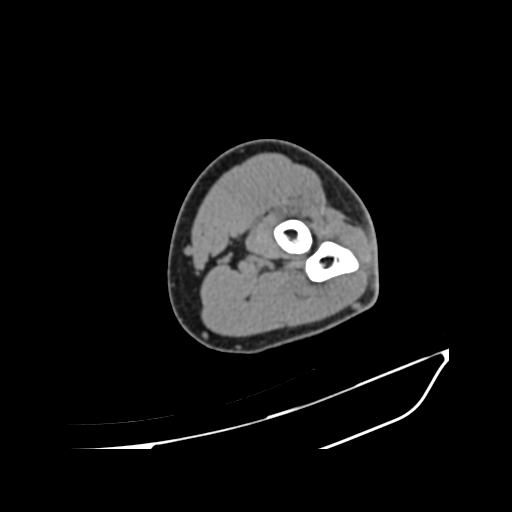
[im 161/403  soft-tissue]
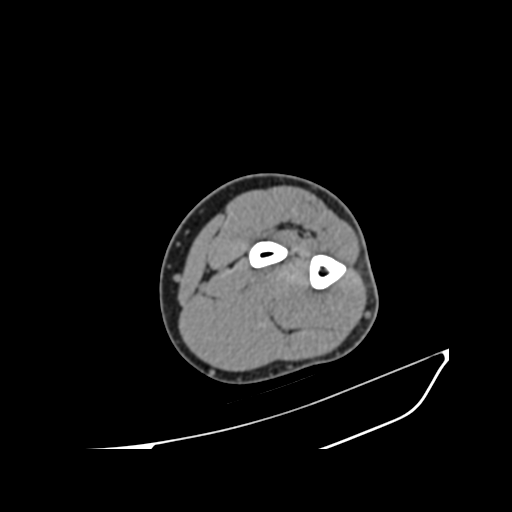
[im 242/403  soft-tissue]
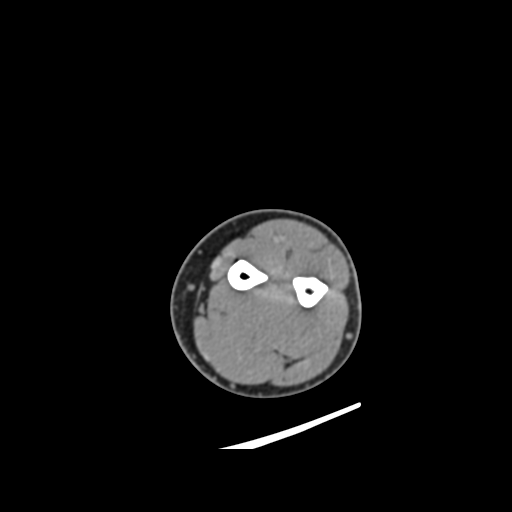
[im 322/403  soft-tissue]
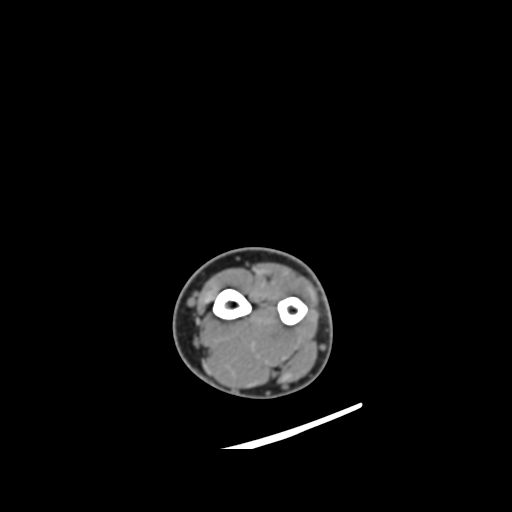

[Series 605: sagittal soft · sagittal · 0.50mm/px · 5 of 190 slices shown, 6 images]
[im 64/190  bone]
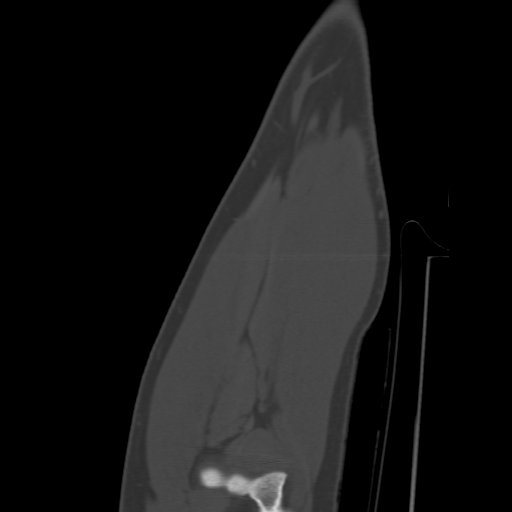
[im 79/190  bone]
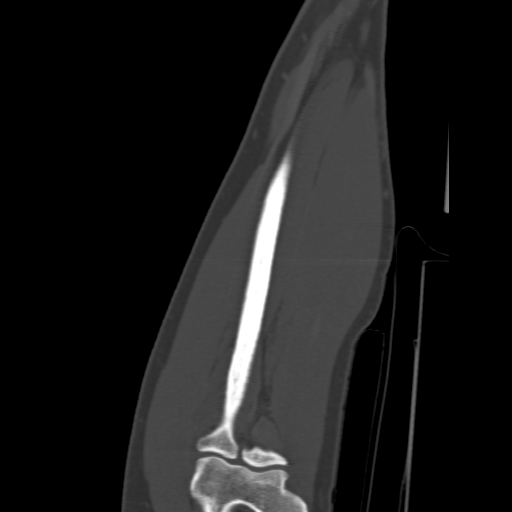
[im 95/190  soft-tissue]
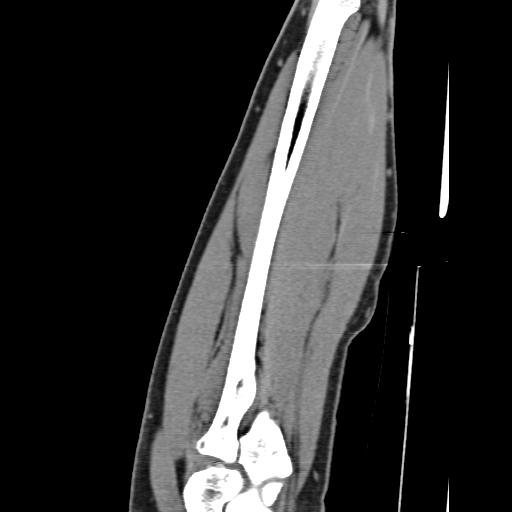
[im 95/190  bone]
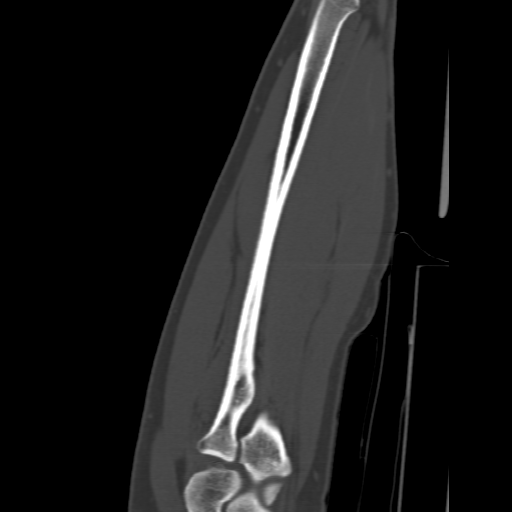
[im 111/190  bone]
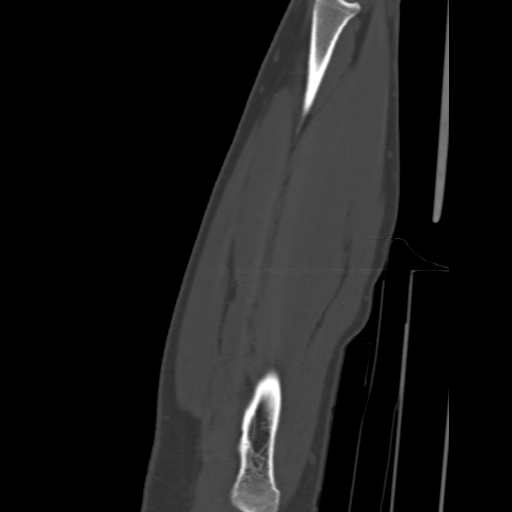
[im 127/190  bone]
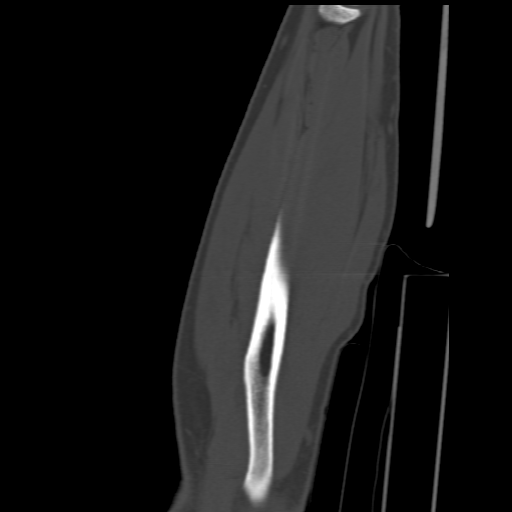

[13 of 29 positions shown; findings below may reference images not displayed]

FINDINGS: There is no acute fracture or dislocation. There is no periosteal
reaction or bone destruction. There is no lytic or sclerotic osseous
lesion.

There is no focal fluid collection or hematoma. There is a soft
tissue laceration along the anterolateral aspect of the mid right
forearm. There is no radiopaque foreign body.

The muscles are normal. There is no muscle atrophy. There is no
intramuscular fluid collection or hematoma.
IMPRESSION: 1. No acute osseous injury of the right forearm.
2. Soft tissue laceration anterolaterally in the mid right forearm
without a radiopaque foreign body.

## 2017-09-13 ENCOUNTER — Other Ambulatory Visit: Payer: Self-pay

## 2017-09-13 ENCOUNTER — Ambulatory Visit (INDEPENDENT_AMBULATORY_CARE_PROVIDER_SITE_OTHER): Payer: 59

## 2017-09-13 ENCOUNTER — Ambulatory Visit
Admission: EM | Admit: 2017-09-13 | Discharge: 2017-09-13 | Disposition: A | Discharge status: Home / Self Care | Payer: 59 | Attending: Family Medicine | Admitting: Family Medicine

## 2017-09-13 DIAGNOSIS — M79672 Pain in left foot: Secondary | ICD-10-CM

## 2017-09-13 DIAGNOSIS — S93602A Unspecified sprain of left foot, initial encounter: Secondary | ICD-10-CM | POA: Diagnosis not present

## 2017-09-13 HISTORY — DX: Other allergic rhinitis: J30.89

## 2017-09-13 NOTE — Discharge Instructions (Addendum)
Ice. Stretch. Rest. Drink plenty of fluids.   Follow up with your primary care physician this week as needed. Return to Urgent care for new or worsening concerns.

## 2017-09-13 NOTE — ED Provider Notes (Signed)
[facility] ____________________________________________  Time seen: Approximately 4:43 PM  I have reviewed the triage vital signs and the nursing notes.   HISTORY  Chief Complaint Foot Pain   HPI Roberto Barnes is a 27 y.o. male reason for evaluation of left foot pain present for the last 3 to 4 days.  Patient states that he vaguely remembers stepping down off of a truck last week and felt some pain to his left foot, but continue with his activity.   States the last couple days he has had increased pain to his left lateral foot.  Denies any other known injury.  Reports did take one naproxen earlier which helps some. Does stay on his feet a lot and walk a lot.  No other injury.  Denies previous break to the same area.  Denies any skin changes.  Denies pain radiation.  Reports otherwise feels well. Denies recent sickness. Denies recent antibiotic use.    Past Medical History:  Diagnosis Date  . Breast mass    left breast mass x1 month  . Environmental and seasonal allergies   . GERD (gastroesophageal reflux disease)     Patient Active Problem List   Diagnosis Date Noted  . Foreign body (FB) in soft tissue 07/26/2015    Past Surgical History:  Procedure Laterality Date  . BREAST BIOPSY Left 05/21/2015   path pending  . TONSILLECTOMY       No current facility-administered medications for this encounter.   Current Outpatient Medications:  .  levocetirizine (XYZAL) 5 MG tablet, Take 5 mg by mouth every evening., Disp: , Rfl:  .  ranitidine (ZANTAC) 150 MG capsule, Take 150 mg by mouth as needed for heartburn., Disp: , Rfl:   Allergies Patient has no known allergies.  History reviewed. No pertinent family history.  Social History Social History   Tobacco Use  . Smoking status: Never Smoker  . Smokeless tobacco: Current User    Types: Snuff  Substance Use Topics  . Alcohol use: Yes    Comment: social  . Drug use: Not on file    Review of  Systems Constitutional: No fever/chills Cardiovascular: Denies chest pain. Respiratory: Denies shortness of breath. Gastrointestinal: No abdominal pain.   Musculoskeletal: Negative for back pain.as above. ____________________________________________   PHYSICAL EXAM:  VITAL SIGNS: ED Triage Vitals  Enc Vitals Group     BP 09/13/17 1518 (!) 147/85     Pulse Rate 09/13/17 1518 85     Resp 09/13/17 1518 18     Temp 09/13/17 1518 97.9 F (36.6 C)     Temp src --      SpO2 09/13/17 1518 100 %     Weight 09/13/17 1521 (!) 340 lb (154.2 kg)     Height 09/13/17 1521  (1.727 m)     Head Circumference --      Peak Flow --      Pain Score 09/13/17 1520 5     Pain Loc --      Pain Edu? --      Excl. in GC? --     Constitutional: Alert and oriented. Well appearing and in no acute distress. ENT      Head: Normocephalic and atraumatic. Cardiovascular: Normal rate, regular rhythm. Grossly normal heart sounds.  Good peripheral circulation. Respiratory: Normal respiratory effort without tachypnea nor retractions. Breath sounds are clear and equal bilaterally. No wheezes, rales, rhonchi. Musculoskeletal:Bilateral pedal pulses equal and easily palpated.  Except: Left foot mid to lateral  midfoot mild tenderness to direct palpation, mild localized edema, no ecchymosis, skin intact, no erythema, full range of motion present, normal distal sensation and capillary refill.  Amatory with steady gait. Neurologic:  Normal speech and language.Speech is normal. Skin:  Skin is warm, dry and intact. No rash noted. Psychiatric: Mood and affect are normal. Speech and behavior are normal. Patient exhibits appropriate insight and judgment   ___________________________________________   LABS (all labs ordered are listed, but only abnormal results are displayed)  Labs Reviewed - No data to display ____________________________________________  RADIOLOGY  Dg Foot Complete Left  Result Date:  09/13/2017 CLINICAL DATA:  Left lateral foot pain EXAM: LEFT FOOT - COMPLETE 3+ VIEW COMPARISON:  None. FINDINGS: There is no evidence of fracture or dislocation. There is no evidence of arthropathy or other focal bone abnormality. Soft tissues are unremarkable. IMPRESSION: No acute osseous injury of the left foot. Electronically Signed   By: Elige Ko   On: 09/13/2017 16:23   ____________________________________________   PROCEDURES Procedures    INITIAL IMPRESSION / ASSESSMENT AND PLAN / ED COURSE  Pertinent labs & imaging results that were available during my care of the patient were reviewed by me and considered in my medical decision making (see chart for details).  Well-appearing patient.  No acute distress.  Left foot pain.  Suspect sprain injury.  Left foot x-ray negative.  Encourage rest, ice, supportive care.  Velcro splint given.  Over-the-counter ibuprofen.  Gradual increase activity as tolerated.  Discussed follow up with Primary care physician this week. Discussed follow up and return parameters including no resolution or any worsening concerns. Patient verbalized understanding and agreed to plan.   ____________________________________________   FINAL CLINICAL IMPRESSION(S) / ED DIAGNOSES  Final diagnoses:  Sprain of left foot, initial encounter     ED Discharge Orders    None       Note: This dictation was prepared with Dragon dictation along with smaller phrase technology. Any transcriptional errors that result from this process are unintentional.         Renford Dills, NP 09/13/17 1646

## 2017-09-13 NOTE — ED Triage Notes (Signed)
Pt with left lateral foot pain starting on Friday. Unsure of injury. Worse with pressure. No pain at rest but increases to 5/10 with ambulation

## 2018-08-02 ENCOUNTER — Ambulatory Visit
Admission: EM | Admit: 2018-08-02 | Discharge: 2018-08-02 | Disposition: A | Discharge status: Home / Self Care | Payer: 59 | Attending: Family Medicine | Admitting: Family Medicine

## 2018-08-02 ENCOUNTER — Other Ambulatory Visit: Payer: Self-pay

## 2018-08-02 ENCOUNTER — Encounter: Payer: Self-pay | Admitting: Emergency Medicine

## 2018-08-02 DIAGNOSIS — L239 Allergic contact dermatitis, unspecified cause: Secondary | ICD-10-CM | POA: Diagnosis not present

## 2018-08-02 DIAGNOSIS — R21 Rash and other nonspecific skin eruption: Secondary | ICD-10-CM | POA: Diagnosis not present

## 2018-08-02 MED ORDER — PREDNISONE 10 MG PO TABS: ORAL_TABLET | ORAL | 0 refills | Status: DC

## 2018-08-02 NOTE — Discharge Instructions (Addendum)
Take medication as prescribed. Rest. Drink plenty of fluids. Avoid scratching. Avoid trigger. Over the counter antihistamines as discussed.   Follow up with your primary care physician this week as needed. Return to Urgent care for new or worsening concerns.

## 2018-08-02 NOTE — ED Triage Notes (Signed)
Patient c/o itchy red rash on his arms and palm of his hands that started 2-3 days ago.

## 2018-08-02 NOTE — ED Provider Notes (Signed)
[facility] ____________________________________________  Time seen: Approximately 1:27 PM  I have reviewed the triage vital signs and the nursing notes.   HISTORY  Chief Complaint Rash   HPI Roberto Barnes is a 28 y.o. male presenting for evaluation of itchy rash present to both arms.  States the rash started on his arms has spread up and down both arms, but remaining only in the exposed T-shirt line area.  States also having some itching to the base of his right palm but no rash to palm.  Also rash to posterior right leg.  States very itchy.  No pain.  Denies known trigger, but states he suspects it was from working outside mowing the yard as he mowed his neighbor's yard along the tree line and possible poison oak and ivy.  Tried some topical creams without resolution.  Does also have seasonal allergies and has been taken Benadryl.  Denies cough, congestion, fevers, sore throat, difficulty swallowing.  Denies changes in foods, medicines, lotions, detergents or other contacts.  Denies others in the house with similar.  Denies other aggravating alleviating factors.  No insect bites.   Past Medical History:  Diagnosis Date  . Breast mass    left breast mass x1 month  . Environmental and seasonal allergies   . GERD (gastroesophageal reflux disease)     Patient Active Problem List   Diagnosis Date Noted  . Foreign body (FB) in soft tissue 07/26/2015    Past Surgical History:  Procedure Laterality Date  . BREAST BIOPSY Left 05/21/2015   path pending  . TONSILLECTOMY       No current facility-administered medications for this encounter.   Current Outpatient Medications:  .  ranitidine (ZANTAC) 150 MG capsule, Take 150 mg by mouth as needed for heartburn., Disp: , Rfl:  .  predniSONE (DELTASONE) 10 MG tablet, Take 60mg  orally day one, then 55 mg orally day two, then 50 mg orally day three, then taper by 5 mg daily over 12 days until complete., Disp: 35 tablet, Rfl: 0   Allergies Patient has no known allergies.  Family History  Problem Relation Age of Onset  . Healthy Mother   . Diabetes Father     Social History Social History   Tobacco Use  . Smoking status: Never Smoker  . Smokeless tobacco: Current User    Types: Snuff  Substance Use Topics  . Alcohol use: Yes    Comment: social  . Drug use: Not on file    Review of Systems Constitutional: No fever Cardiovascular: Denies chest pain. Respiratory: Denies shortness of breath. Gastrointestinal: No abdominal pain.  Musculoskeletal: Negative for back pain. Skin: Positive rash ____________________________________________   PHYSICAL EXAM:  VITAL SIGNS: ED Triage Vitals  Enc Vitals Group     BP 08/02/18 1253 (!) 146/90     Pulse Rate 08/02/18 1253 86     Resp 08/02/18 1253 16     Temp 08/02/18 1253 97.9 F (36.6 C)     Temp Source 08/02/18 1253 Oral     SpO2 08/02/18 1253 100 %     Weight 08/02/18 1250 (!) 345 lb (156.5 kg)     Height 08/02/18 1250 5\' 9"  (1.753 m)     Head Circumference --      Peak Flow --      Pain Score 08/02/18 1249 0     Pain Loc --      Pain Edu? --      Excl. in GC? --  Constitutional: Alert and oriented. Well appearing and in no acute distress. ENT      Head: Normocephalic and atraumatic. Cardiovascular: Normal rate, regular rhythm. Grossly normal heart sounds.  Good peripheral circulation. Respiratory: Normal respiratory effort without tachypnea nor retractions. Breath sounds are clear and equal bilaterally. No wheezes, rales, rhonchi. Musculoskeletal: Steady gait.  No edema noted. Neurologic:  Normal speech and language. Speech is normal. No gait instability.  Skin:  Skin is warm, dry . Except: Bilateral arms below T shirt line scattered mildly erythematous macular and papular pruritic rash, nonvesicular, no surrounding erythema, no drainage, no induration, no rash to palms.  Psychiatric: Mood and affect are normal. Speech and behavior are  normal. Patient exhibits appropriate insight and judgment   ___________________________________________   LABS (all labs ordered are listed, but only abnormal results are displayed)  Labs Reviewed - No data to display ____________________________________________   PROCEDURES Procedures   INITIAL IMPRESSION / ASSESSMENT AND PLAN / ED COURSE  Pertinent labs & imaging results that were available during my care of the patient were reviewed by me and considered in my medical decision making (see chart for details).  Well-appearing patient.  No acute distress.  Pruritic rash, suspect plant contact dermatitis.  Continue supportive care at home, avoid trigger, avoid scratching, oral antihistamines and will start prednisone.Discussed indication, risks and benefits of medications with patient.  Discussed follow up with Primary care physician this week. Discussed follow up and return parameters including no resolution or any worsening concerns. Patient verbalized understanding and agreed to plan.   ____________________________________________   FINAL CLINICAL IMPRESSION(S) / ED DIAGNOSES  Final diagnoses:  Rash  Allergic contact dermatitis, unspecified trigger     ED Discharge Orders         Ordered    predniSONE (DELTASONE) 10 MG tablet     08/02/18 1306           Note: This dictation was prepared with Dragon dictation along with smaller phrase technology. Any transcriptional errors that result from this process are unintentional.         Renford Dills, NP 08/02/18 1333

## 2020-02-01 ENCOUNTER — Other Ambulatory Visit: Payer: Self-pay

## 2020-09-09 ENCOUNTER — Encounter: Payer: Self-pay | Admitting: Emergency Medicine

## 2020-09-09 ENCOUNTER — Other Ambulatory Visit: Payer: Self-pay

## 2020-09-09 ENCOUNTER — Ambulatory Visit
Admission: EM | Admit: 2020-09-09 | Discharge: 2020-09-09 | Disposition: A | Discharge status: Home / Self Care | Payer: 59 | Attending: Physician Assistant | Admitting: Physician Assistant

## 2020-09-09 DIAGNOSIS — R2242 Localized swelling, mass and lump, left lower limb: Secondary | ICD-10-CM

## 2020-09-09 DIAGNOSIS — M79672 Pain in left foot: Secondary | ICD-10-CM | POA: Diagnosis not present

## 2020-09-09 MED ORDER — NAPROXEN 500 MG PO TABS: 500.0000 mg | ORAL_TABLET | Freq: Two times a day (BID) | ORAL | 0 refills | Status: AC

## 2020-09-09 NOTE — ED Provider Notes (Signed)
[facility]    CSN: [id number] Arrival date & time: 09/09/20  0951      History   Chief Complaint Chief Complaint  Patient presents with  . Foot Pain  . Foot Swelling    HPI Roberto Barnes is a 30 y.o. male presenting for left foot swelling and pain x2 days.  He denies any injury and states he woke up with the pain and swelling.  He has applied ice a couple of times and taken 800 mg ibuprofen tablet with mild improvement in the pain.  Patient denies any recent increase in activity.  No falls or twisting injuries to his ankle or foot.  No numbness or tingling.  No weakness.  Patient states it hurts severely to move the foot at all.  No fevers, redness or bruising.  No other complaints or concerns.  HPI  Past Medical History:  Diagnosis Date  . Breast mass    left breast mass x1 month  . Environmental and seasonal allergies   . GERD (gastroesophageal reflux disease)     Patient Active Problem List   Diagnosis Date Noted  . Foreign body (FB) in soft tissue 07/26/2015    Past Surgical History:  Procedure Laterality Date  . BREAST BIOPSY Left 05/21/2015   path pending  . TONSILLECTOMY         Home Medications    Prior to Admission medications   Medication Sig Start Date End Date Taking? Authorizing Provider  naproxen (NAPROSYN) 500 MG tablet Take 1 tablet (500 mg total) by mouth 2 (two) times daily with a meal for 10 days. 09/09/20 09/19/20 Yes Shirlee Latch, PA-C  predniSONE (DELTASONE) 10 MG tablet Take 60mg  orally day one, then 55 mg orally day two, then 50 mg orally day three, then taper by 5 mg daily over 12 days until complete. 08/02/18   08/04/18, NP  ranitidine (ZANTAC) 150 MG capsule Take 150 mg by mouth as needed for heartburn.    [provider]    Family History Family History  Problem Relation Age of Onset  . Healthy Mother   . Diabetes Father     Social History Social History   Tobacco Use  . Smoking status: Never  Smoker  . Smokeless tobacco: Current User    Types: Snuff  Vaping Use  . Vaping Use: Never used  Substance Use Topics  . Alcohol use: Yes    Comment: social     Allergies   Patient has no known allergies.   Review of Systems Review of Systems  Constitutional: Negative for fever.  Musculoskeletal: Positive for arthralgias, gait problem and joint swelling.  Skin: Negative for color change, rash and wound.  Neurological: Negative for weakness and numbness.     Physical Exam Triage Vital Signs ED Triage Vitals  Enc Vitals Group     BP 09/09/20 1043 (!) 148/103     Pulse Rate 09/09/20 1043 81     Resp 09/09/20 1043 18     Temp 09/09/20 1043 98.4 F (36.9 C)     Temp Source 09/09/20 1043 Oral     SpO2 09/09/20 1043 98 %     Weight 09/09/20 1042 (!) 345 lb (156.5 kg)     Height 09/09/20 1042 5\' 8"  (1.727 m)     Head Circumference --      Peak Flow --      Pain Score 09/09/20 1042 3     Pain Loc --  Pain Edu? --      Excl. in GC? --    No data found.  Updated Vital Signs BP (!) 148/103 (BP Location: Right Arm)   Pulse 81   Temp 98.4 F (36.9 C) (Oral)   Resp 18   Ht 5\' 8"  (1.727 m)   Wt (!) 345 lb (156.5 kg)   SpO2 98%   BMI 52.46 kg/m       Physical Exam Vitals and nursing note reviewed.  Constitutional:      General: He is not in acute distress.    Appearance: Normal appearance. He is well-developed. He is obese. He is not ill-appearing.  HENT:     Head: Normocephalic and atraumatic.  Eyes:     General: No scleral icterus.    Conjunctiva/sclera: Conjunctivae normal.  Cardiovascular:     Rate and Rhythm: Normal rate and regular rhythm.     Pulses: Normal pulses.  Pulmonary:     Effort: Pulmonary effort is normal. No respiratory distress.     Breath sounds: Normal breath sounds.  Musculoskeletal:     Cervical back: Neck supple.     Comments: Left foot: Moderate swelling of the lateral foot and ankle.  Tenderness to palpation of the lateral  metatarsals and distal to the lateral malleolus.  Decreased range of motion of foot due to pain.  Good pulses and sensation.  Good strength.  Skin:    General: Skin is warm and dry.     Findings: No bruising, lesion or rash.  Neurological:     General: No focal deficit present.     Mental Status: He is alert. Mental status is at baseline.     Motor: No weakness.     Gait: Gait normal.  Psychiatric:        Mood and Affect: Mood normal.        Thought Content: Thought content normal.      UC Treatments / Results  Labs (all labs ordered are listed, but only abnormal results are displayed) Labs Reviewed - No data to display  EKG   Radiology No results found.  Procedures Procedures (including critical care time)  Medications Ordered in UC Medications - No data to display  Initial Impression / Assessment and Plan / UC Course  I have reviewed the triage vital signs and the nursing notes.  Pertinent labs & imaging results that were available during my care of the patient were reviewed by me and considered in my medical decision making (see chart for details).   30 year old male presenting for atraumatic left foot pain x2 days.  Supportive care advised at this time.  I have sent naproxen and advised over-the-counter Voltaren gel as well as RICE and Ace bandage or ankle brace.  He was offered Ace wrap for ankle bandage in the clinic but states he will purchase one over-the-counter.  He also asked for a work note so I provided him 1 for 2 days.  Advised patient that if not improving over the next 7 to 10 days she should follow-up with his PCP or Ortho.   Final Clinical Impressions(s) / UC Diagnoses   Final diagnoses:  Left foot pain  Localized swelling of left foot     Discharge Instructions     FOOT PAIN: Stressed avoiding painful activities . Reviewed RICE guidelines. Use medications as directed, including NSAIDs. If no NSAIDs have been prescribed for you today, you may take  Aleve or Motrin over the counter. May use Tylenol in  between doses of NSAIDs.  Also I suggest OTC Voltaren gel. If no improvement in the next 1-2 weeks, f/u with PCP or return to our office for reexamination, and please feel free to call or return at any time for any questions or concerns you may have and we will be happy to help you!        ED Prescriptions    Medication Sig Dispense Auth. Provider   naproxen (NAPROSYN) 500 MG tablet Take 1 tablet (500 mg total) by mouth 2 (two) times daily with a meal for 10 days. 20 tablet Gareth Morgan     PDMP not reviewed this encounter.   Shirlee Latch, PA-C 09/09/20 1137

## 2020-09-09 NOTE — ED Triage Notes (Signed)
Patient c/o left foot pain and swelling that started Saturday. Denies injury.

## 2020-09-09 NOTE — Discharge Instructions (Addendum)
FOOT PAIN: Stressed avoiding painful activities . Reviewed RICE guidelines. Use medications as directed, including NSAIDs. If no NSAIDs have been prescribed for you today, you may take Aleve or Motrin over the counter. May use Tylenol in between doses of NSAIDs.  Also I suggest OTC Voltaren gel. If no improvement in the next 1-2 weeks, f/u with PCP or return to our office for reexamination, and please feel free to call or return at any time for any questions or concerns you may have and we will be happy to help you!

## 2020-09-17 ENCOUNTER — Other Ambulatory Visit: Payer: Self-pay

## 2020-09-17 DIAGNOSIS — Z0283 Encounter for blood-alcohol and blood-drug test: Secondary | ICD-10-CM

## 2020-09-17 NOTE — Progress Notes (Signed)
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  Acct #:  [id number] Specimen #:  [id number]  AMD

## 2020-10-01 NOTE — Progress Notes (Deleted)
Scheduled to complete physical 10/09/20 with Adam Scarboro, NP-C.  AMD 

## 2021-12-11 ENCOUNTER — Other Ambulatory Visit: Payer: Self-pay

## 2021-12-11 DIAGNOSIS — Z0283 Encounter for blood-alcohol and blood-drug test: Secondary | ICD-10-CM

## 2021-12-11 NOTE — Progress Notes (Signed)
Presents to [facility] for on-site random DOT drug screen.  [facility] Acct #:  [id number] [facility] Specimen #:  [id number]  1st attempt at 3:40 pm - insufficient amount of urine given  Iniated Shy Bladder Protocol 3:40 pm - 16 oz water 4:00 pm - 16 oz water  2nd attempt at 4:10 pm - sufficient amount of urine given   AMD

## 2022-05-08 ENCOUNTER — Other Ambulatory Visit: Payer: Self-pay

## 2022-05-08 DIAGNOSIS — Z0283 Encounter for blood-alcohol and blood-drug test: Secondary | ICD-10-CM

## 2022-05-08 NOTE — Progress Notes (Signed)
Presents to [place] [facility] for random DOT drug screen.  [facility] Acct #:  [id number] [facility] Specimen #:  [id number]  AMD

## 2022-07-08 ENCOUNTER — Ambulatory Visit: Payer: Self-pay | Admitting: Physician Assistant

## 2022-08-19 ENCOUNTER — Ambulatory Visit: Payer: Self-pay

## 2022-08-19 DIAGNOSIS — Z0000 Encounter for general adult medical examination without abnormal findings: Secondary | ICD-10-CM

## 2022-08-19 LAB — POCT URINALYSIS DIPSTICK
Bilirubin, UA: NEGATIVE
Glucose, UA: NEGATIVE
Ketones, UA: NEGATIVE
Leukocytes, UA: NEGATIVE
Nitrite, UA: NEGATIVE
Protein, UA: NEGATIVE
Spec Grav, UA: 1.025 (ref 1.010–1.025)
Urobilinogen, UA: 0.2 E.U./dL
pH, UA: 6 (ref 5.0–8.0)

## 2022-08-19 NOTE — Progress Notes (Signed)
  Pt completed lab portion of sceduled physical.   

## 2022-08-20 LAB — CMP12+LP+TP+TSH+6AC+CBC/D/PLT
ALT: 27 IU/L (ref 0–44)
AST: 29 IU/L (ref 0–40)
Albumin/Globulin Ratio: 1.2 (ref 1.2–2.2)
Albumin: 4 g/dL — ABNORMAL LOW (ref 4.3–5.2)
Alkaline Phosphatase: 92 IU/L (ref 44–121)
BUN/Creatinine Ratio: 9 (ref 9–20)
BUN: 10 mg/dL (ref 6–20)
Basophils Absolute: 0.1 10*3/uL (ref 0.0–0.2)
Basos: 1 %
Bilirubin Total: 0.6 mg/dL (ref 0.0–1.2)
Calcium: 9 mg/dL (ref 8.7–10.2)
Chloride: 101 mmol/L (ref 96–106)
Chol/HDL Ratio: 4.2 ratio (ref 0.0–5.0)
Cholesterol, Total: 162 mg/dL (ref 100–199)
Creatinine, Ser: 1.1 mg/dL (ref 0.76–1.27)
EOS (ABSOLUTE): 0.5 10*3/uL — ABNORMAL HIGH (ref 0.0–0.4)
Eos: 5 %
Estimated CHD Risk: 0.8 times avg. (ref 0.0–1.0)
Free Thyroxine Index: 2 (ref 1.2–4.9)
GGT: 39 IU/L (ref 0–65)
Globulin, Total: 3.3 g/dL (ref 1.5–4.5)
Glucose: 107 mg/dL — ABNORMAL HIGH (ref 70–99)
HDL: 39 mg/dL — ABNORMAL LOW (ref >39)
Hematocrit: 46.2 % (ref 37.5–51.0)
Hemoglobin: 15.6 g/dL (ref 13.0–17.7)
Immature Grans (Abs): 0.1 10*3/uL (ref 0.0–0.1)
Immature Granulocytes: 1 %
Iron: 70 ug/dL (ref 38–169)
LDH: 204 IU/L (ref 121–224)
LDL Chol Calc (NIH): 97 mg/dL (ref 0–99)
Lymphocytes Absolute: 2.3 10*3/uL (ref 0.7–3.1)
Lymphs: 24 %
MCH: 26.9 pg (ref 26.6–33.0)
MCHC: 33.8 g/dL (ref 31.5–35.7)
MCV: 80 fL (ref 79–97)
Monocytes Absolute: 0.9 10*3/uL (ref 0.1–0.9)
Monocytes: 9 %
Neutrophils Absolute: 6 10*3/uL (ref 1.4–7.0)
Neutrophils: 60 %
Phosphorus: 3.1 mg/dL (ref 2.8–4.1)
Platelets: 292 10*3/uL (ref 150–450)
Potassium: 4.6 mmol/L (ref 3.5–5.2)
RBC: 5.8 x10E6/uL (ref 4.14–5.80)
RDW: 13.8 % (ref 11.6–15.4)
Sodium: 140 mmol/L (ref 134–144)
T3 Uptake Ratio: 29 % (ref 24–39)
T4, Total: 6.8 ug/dL (ref 4.5–12.0)
TSH: 2.42 u[IU]/mL (ref 0.450–4.500)
Total Protein: 7.3 g/dL (ref 6.0–8.5)
Triglycerides: 146 mg/dL (ref 0–149)
Uric Acid: 8 mg/dL (ref 3.8–8.4)
VLDL Cholesterol Cal: 26 mg/dL (ref 5–40)
WBC: 9.9 10*3/uL (ref 3.4–10.8)
eGFR: 93 mL/min/{1.73_m2} (ref >59)

## 2022-08-26 ENCOUNTER — Encounter: Payer: Self-pay | Admitting: Physician Assistant

## 2022-09-02 ENCOUNTER — Encounter: Payer: Self-pay | Admitting: Physician Assistant

## 2022-09-10 ENCOUNTER — Encounter: Payer: Self-pay | Admitting: Physician Assistant

## 2022-09-10 ENCOUNTER — Ambulatory Visit: Payer: Self-pay | Admitting: Physician Assistant

## 2022-09-10 VITALS — BP 134/82 | HR 75 | Temp 98.0°F | Resp 16 | Ht 68.0 in | Wt 375.0 lb

## 2022-09-10 DIAGNOSIS — Z0000 Encounter for general adult medical examination without abnormal findings: Secondary | ICD-10-CM

## 2022-09-10 NOTE — Progress Notes (Signed)
[facility]  ______________________________   None    (approximate)  I have reviewed the triage vital signs and the nursing notes.   HISTORY  Chief Complaint Annual Exam    HPI Roberto Barnes is a 32 y.o. male patient presents for annual physical exam.  Patient voices no concerns or complaints.         Past Medical History:  Diagnosis Date   Breast mass    left breast mass x1 month   Environmental and seasonal allergies    GERD (gastroesophageal reflux disease)     Patient Active Problem List   Diagnosis Date Noted   Foreign body (FB) in soft tissue 07/26/2015   Fracture of fourth metacarpal bone 06/29/2013    Past Surgical History:  Procedure Laterality Date   BREAST BIOPSY Left 05/21/2015   path pending   TONSILLECTOMY      Prior to Admission medications   Not on File    Allergies Patient has no known allergies.  Family History  Problem Relation Age of Onset   Healthy Mother    Diabetes Father     Social History Social History   Tobacco Use   Smoking status: Never   Smokeless tobacco: Current    Types: Snuff  Vaping Use   Vaping Use: Never used  Substance Use Topics   Alcohol use: Yes    Comment: social    Review of Systems Constitutional: No fever/chills.  Morbid obesity Eyes: No visual changes. ENT: No sore throat. Cardiovascular: Denies chest pain. Respiratory: Denies shortness of breath. Gastrointestinal: No abdominal pain.  No nausea, no vomiting.  No diarrhea.  No constipation. Genitourinary: Negative for dysuria. Musculoskeletal: Negative for back pain. Skin: Negative for rash. Neurological: Negative for headaches, focal weakness or numbness.   ____________________________________________   PHYSICAL EXAM: VITAL SIGNS: BP 134/82  Pulse 75  Resp 16  Temp 98 F (36.7 C)  Temp src Temporal  SpO2 97 %  Weight 375 lb (170.1 kg)  Height 5\' 8"  (1.727 m)   BMI 57.02 kg/m2  BSA  2.86 m2      Constitutional: Alert and oriented. Well appearing and in no acute distress. Eyes: Conjunctivae are normal. PERRL. EOMI. Head: Atraumatic. Nose: No congestion/rhinnorhea. Mouth/Throat: Mucous membranes are moist.  Oropharynx non-erythematous. Neck: No stridor.  No cervical spine tenderness to palpation. Hematological/Lymphatic/Immunilogical: No cervical lymphadenopathy. Cardiovascular: Normal rate, regular rhythm. Grossly normal heart sounds.  Good peripheral circulation. Respiratory: Normal respiratory effort.  No retractions. Lungs CTAB. Gastrointestinal: Soft and nontender. No distention. No abdominal bruits. No CVA tenderness. Genitourinary: Deferred Musculoskeletal: No lower extremity tenderness nor edema.  No joint effusions. Neurologic:  Normal speech and language. No gross focal neurologic deficits are appreciated. No gait instability. Skin:  Skin is warm, dry and intact. No rash noted. Psychiatric: Mood and affect are normal. Speech and behavior are normal.  ____________________________________________   LABS      Component Ref Range & Units 3 wk ago 6 yr ago  Color, UA yellow yellow  Clarity, UA clear cloudy  Glucose, UA Negative Negative negative R  Bilirubin, UA neg negative  Ketones, UA neg negative  Spec Grav, UA 1.010 - 1.025 1.025 1.010 R  Blood, UA trace -+ non hem trace  pH, UA 5.0 - 8.0 6.0 5.0 R  Protein, UA Negative Negative negative R  Urobilinogen, UA 0.2 or 1.0 E.U./dL 0.2 0.2 R  Nitrite, UA neg negative  Leukocytes, UA Negative Negative Negative  Appearance  Odor           Specimen Collected: 08/19/22 09:53 Last Resulted: 08/19/22 09:53      Lab Flowsheet      Order Details      View Encounter      Lab and Collection Details      Routing      Result History    View All Conversations on this Encounter      R=Reference range differs from displayed range      Result Care Coordination   Patient  Communication   Add Comments   Not seen Back to Top      Other Results from 08/19/2022   Contains abnormal data Executive Panel Order: [id number] Status: Final result      Visible to patient: No (inaccessible in MyChart)      Next appt: None      Dx: Routine adult health maintenance    0 Result Notes      Component Ref Range & Units 3 wk ago  Glucose 70 - 99 mg/dL 409 High   Uric Acid 3.8 - 8.4 mg/dL 8.0  Comment:            Therapeutic target for gout patients: <6.0  BUN 6 - 20 mg/dL 10  Creatinine, Ser 8.11 - 1.27 mg/dL 9.14  eGFR >78 GN/FAO/1.30 93  BUN/Creatinine Ratio 9 - 20 9  Sodium 134 - 144 mmol/L 140  Potassium 3.5 - 5.2 mmol/L 4.6  Chloride 96 - 106 mmol/L 101  Calcium 8.7 - 10.2 mg/dL 9.0  Phosphorus 2.8 - 4.1 mg/dL 3.1  Total Protein 6.0 - 8.5 g/dL 7.3  Albumin 4.3 - 5.2 g/dL 4.0 Low   Globulin, Total 1.5 - 4.5 g/dL 3.3  Albumin/Globulin Ratio 1.2 - 2.2 1.2  Bilirubin Total 0.0 - 1.2 mg/dL 0.6  Alkaline Phosphatase 44 - 121 IU/L 92  LDH 121 - 224 IU/L 204  AST 0 - 40 IU/L 29  ALT 0 - 44 IU/L 27  GGT 0 - 65 IU/L 39  Iron 38 - 169 ug/dL 70  Cholesterol, Total 100 - 199 mg/dL 865  Triglycerides 0 - 149 mg/dL 784  HDL >69 mg/dL 39 Low   VLDL Cholesterol Cal 5 - 40 mg/dL 26  LDL Chol Calc (NIH) 0 - 99 mg/dL 97  Chol/HDL Ratio 0.0 - 5.0 ratio 4.2  Comment:                                   T. Chol/HDL Ratio                                             Men  Women                               1/2 Avg.Risk  3.4    3.3                                   Avg.Risk  5.0    4.4  2X Avg.Risk  9.6    7.1                                3X Avg.Risk 23.4   11.0  Estimated CHD Risk 0.0 - 1.0 times avg. 0.8  Comment: The CHD Risk is based on the T. Chol/HDL ratio. Other factors affect CHD Risk such as hypertension, smoking, diabetes, severe obesity, and family history of premature CHD.  TSH 0.450 - 4.500  uIU/mL 2.420  T4, Total 4.5 - 12.0 ug/dL 6.8  T3 Uptake Ratio 24 - 39 % 29  Free Thyroxine Index 1.2 - 4.9 2.0  WBC 3.4 - 10.8 x10E3/uL 9.9  RBC 4.14 - 5.80 x10E6/uL 5.80  Hemoglobin 13.0 - 17.7 g/dL 09.8  Hematocrit 11.9 - 51.0 % 46.2  MCV 79 - 97 fL 80  MCH 26.6 - 33.0 pg 26.9  MCHC 31.5 - 35.7 g/dL 14.7  RDW 82.9 - 56.2 % 13.8  Platelets 150 - 450 x10E3/uL 292  Neutrophils Not Estab. % 60  Lymphs Not Estab. % 24  Monocytes Not Estab. % 9  Eos Not Estab. % 5  Basos Not Estab. % 1  Neutrophils Absolute 1.4 - 7.0 x10E3/uL 6.0  Lymphocytes Absolute 0.7 - 3.1 x10E3/uL 2.3  Monocytes Absolute 0.1 - 0.9 x10E3/uL 0.9  EOS (ABSOLUTE) 0.0 - 0.4 x10E3/uL 0.5 High   Basophils Absolute 0.0 - 0.2 x10E3/uL 0.1  Immature Granulocytes Not Estab. % 1  Immature Grans (Abs)       ____________________________________________    ____________________________________________   INITIAL IMPRESSION / ASSESSMENT AND PLAN  As part of my medical decision making, I reviewed the following data within the electronic MEDICAL RECORD NUMBER       No acute findings on physical exam and labs.      ____________________________________________   FINAL CLINICAL IMPRESSIONS  Well exam   ED Discharge Orders     None        Note:  This document was prepared using Dragon voice recognition software and may include unintentional dictation errors.

## 2022-09-10 NOTE — Progress Notes (Signed)
Pt presents today to complete physical, pt concerned of losing weight and wants to talk about going on injections for weight loss.

## 2022-11-30 ENCOUNTER — Encounter: Payer: Self-pay | Admitting: Physician Assistant

## 2022-11-30 ENCOUNTER — Ambulatory Visit: Payer: Self-pay | Admitting: Physician Assistant

## 2022-11-30 DIAGNOSIS — Z4802 Encounter for removal of sutures: Secondary | ICD-10-CM

## 2022-11-30 NOTE — Progress Notes (Signed)
Pt presents today to have staples removed from head. Roberto Barnes

## 2022-11-30 NOTE — Progress Notes (Signed)
   Subjective: Stable removal    Patient ID: Roberto Barnes, male    DOB: 21-Dec-1990, 32 y.o.   MRN: [id number]  HPI Patient reports for staple removal from scalp secondary to a laceration which occurred 8 days ago.  Denies any complaint.   Review of Systems Negative except for above complaint    Objective:   Physical Exam Vital signs not taken. Patient has 5 intact staples.       Assessment & Plan:   Obtain patient consent.  Area was surgically clean.  5 staples were removed without bleeding.  Patient given discharge care instructions.  Return back if condition worsens.

## 2023-06-11 ENCOUNTER — Ambulatory Visit
Admission: RE | Admit: 2023-06-11 | Discharge: 2023-06-11 | Disposition: A | Discharge status: Home / Self Care | Payer: 59 | Source: Ambulatory Visit | Attending: Emergency Medicine | Admitting: Emergency Medicine

## 2023-06-11 VITALS — BP 133/83 | HR 76 | Temp 98.2°F | Resp 15 | Ht 68.0 in | Wt 360.0 lb

## 2023-06-11 DIAGNOSIS — J069 Acute upper respiratory infection, unspecified: Secondary | ICD-10-CM | POA: Insufficient documentation

## 2023-06-11 LAB — RESP PANEL BY RT-PCR (FLU A&B, COVID) ARPGX2
Influenza A by PCR: NEGATIVE
Influenza B by PCR: NEGATIVE
SARS Coronavirus 2 by RT PCR: NEGATIVE

## 2023-06-11 LAB — GROUP A STREP BY PCR: Group A Strep by PCR: NOT DETECTED

## 2023-06-11 MED ORDER — BENZONATATE 100 MG PO CAPS: 200.0000 mg | ORAL_CAPSULE | Freq: Three times a day (TID) | ORAL | 0 refills | Status: AC

## 2023-06-11 MED ORDER — IPRATROPIUM BROMIDE 0.06 % NA SOLN: 2.0000 | Freq: Four times a day (QID) | NASAL | 12 refills | Status: AC

## 2023-06-11 MED ORDER — PROMETHAZINE-DM 6.25-15 MG/5ML PO SYRP: 5.0000 mL | ORAL_SOLUTION | Freq: Four times a day (QID) | ORAL | 0 refills | Status: AC | PRN

## 2023-06-11 NOTE — ED Triage Notes (Signed)
 Patient c/o sore throat, cough, runny nose, headaches and fever that started on Wed.

## 2023-06-11 NOTE — ED Provider Notes (Signed)
 [facility]    CSN: [id number] Arrival date & time: 06/11/23  4098      History   Chief Complaint Chief Complaint  Patient presents with   Sore Throat    Appointment   Cough    HPI Roberto Barnes is a 33 y.o. male.   HPI  33 year old male with a past medical history significant for environmental and seasonal allergies, GERD, and left breast mass presents for evaluation of respiratory symptoms that began 2 days ago and consist of fever with a Tmax of 100.7, headache, runny nose nasal congestion, sore throat, and a nonproductive cough.  The day of symptoms began he had diarrhea but he has not had any since.  He denies any ear pain, short breath, wheezing, nausea, or vomiting.  Both of his children are also running fevers.  Past Medical History:  Diagnosis Date   Breast mass    left breast mass x1 month   Environmental and seasonal allergies    GERD (gastroesophageal reflux disease)     Patient Active Problem List   Diagnosis Date Noted   Foreign body (FB) in soft tissue 07/26/2015   Fracture of fourth metacarpal bone 06/29/2013    Past Surgical History:  Procedure Laterality Date   BREAST BIOPSY Left 05/21/2015   path pending   TONSILLECTOMY         Home Medications    Prior to Admission medications   Medication Sig Start Date End Date Taking? Authorizing Provider  benzonatate (TESSALON) 100 MG capsule Take 2 capsules (200 mg total) by mouth every 8 (eight) hours. 06/11/23  Yes Becky Augusta, NP  ipratropium (ATROVENT) 0.06 % nasal spray Place 2 sprays into both nostrils 4 (four) times daily. 06/11/23  Yes Becky Augusta, NP  promethazine-dextromethorphan (PROMETHAZINE-DM) 6.25-15 MG/5ML syrup Take 5 mLs by mouth 4 (four) times daily as needed. 06/11/23  Yes Becky Augusta, NP    Family History Family History  Problem Relation Age of Onset   Healthy Mother    Diabetes Father     Social History Social History   Tobacco Use   Smoking status: Never    Smokeless tobacco: Current    Types: Snuff  Vaping Use   Vaping status: Never Used  Substance Use Topics   Alcohol use: Yes    Comment: social     Allergies   Patient has no known allergies.   Review of Systems Review of Systems  Constitutional:  Positive for fever.  HENT:  Positive for congestion, rhinorrhea and sore throat. Negative for ear pain.   Respiratory:  Positive for cough. Negative for shortness of breath and wheezing.   Gastrointestinal:  Positive for diarrhea. Negative for nausea and vomiting.  Neurological:  Positive for headaches.     Physical Exam Triage Vital Signs ED Triage Vitals  Encounter Vitals Group     BP 06/11/23 0929 133/83     Systolic BP Percentile --      Diastolic BP Percentile --      Pulse Rate 06/11/23 0929 76     Resp 06/11/23 0929 15     Temp 06/11/23 0929 98.2 F (36.8 C)     Temp Source 06/11/23 0929 Oral     SpO2 06/11/23 0929 98 %     Weight 06/11/23 0928 (!) 360 lb (163.3 kg)     Height 06/11/23 0928 5\' 8"  (1.727 m)     Head Circumference --      Peak Flow --  Pain Score 06/11/23 0928 7     Pain Loc --      Pain Education --      Exclude from Growth Chart --    No data found.  Updated Vital Signs BP 133/83 (BP Location: Left Arm)   Pulse 76   Temp 98.2 F (36.8 C) (Oral)   Resp 15   Ht 5\' 8"  (1.727 m)   Wt (!) 360 lb (163.3 kg)   SpO2 98%   BMI 54.74 kg/m   Visual Acuity Right Eye Distance:   Left Eye Distance:   Bilateral Distance:    Right Eye Near:   Left Eye Near:    Bilateral Near:     Physical Exam Vitals and nursing note reviewed.  Constitutional:      Appearance: Normal appearance. He is not ill-appearing.  HENT:     Head: Normocephalic and atraumatic.     Right Ear: Tympanic membrane, ear canal and external ear normal. There is no impacted cerumen.     Left Ear: Tympanic membrane, ear canal and external ear normal. There is no impacted cerumen.     Nose: Congestion and rhinorrhea  present.     Comments: Mucosa is erythematous and edematous with clear discharge in both nares.    Mouth/Throat:     Mouth: Mucous membranes are moist.     Pharynx: Oropharynx is clear. Posterior oropharyngeal erythema present. No oropharyngeal exudate.     Comments: Tonsillar pillars and soft palate are erythematous and 1+ edematous without exudate.  Posterior pharynx also demonstrates erythema with clear postnasal drip. Cardiovascular:     Rate and Rhythm: Normal rate and regular rhythm.     Pulses: Normal pulses.     Heart sounds: Normal heart sounds. No murmur heard.    No friction rub. No gallop.  Pulmonary:     Effort: Pulmonary effort is normal.     Breath sounds: Normal breath sounds. No wheezing, rhonchi or rales.  Musculoskeletal:     Cervical back: Normal range of motion and neck supple. No tenderness.  Lymphadenopathy:     Cervical: No cervical adenopathy.  Skin:    General: Skin is warm and dry.     Capillary Refill: Capillary refill takes less than 2 seconds.     Findings: No rash.  Neurological:     General: No focal deficit present.     Mental Status: He is alert and oriented to person, place, and time.      UC Treatments / Results  Labs (all labs ordered are listed, but only abnormal results are displayed) Labs Reviewed  GROUP A STREP BY PCR  RESP PANEL BY RT-PCR (FLU A&B, COVID) [id number]    EKG   Radiology No results found.  Procedures Procedures (including critical care time)  Medications Ordered in UC Medications - No data to display  Initial Impression / Assessment and Plan / UC Course  I have reviewed the triage vital signs and the nursing notes.  Pertinent labs & imaging results that were available during my care of the patient were reviewed by me and considered in my medical decision making (see chart for details).   Patient is a nontoxic-appearing 32 year old male presenting for evaluation of respiratory symptoms as outlined HPI above.  His  physical exam does reveal inflammation of his upper respiratory tract but his lungs are clear to auscultation all fields.  He is able to speak in full sentence without dyspnea or tachypnea.  Respiratory rate at triage was  15 with a 98% room her oxygen saturation.  He is currently afebrile with an oral temp of 98.2.  Differential diagnosis include COVID, influenza, strep throat, viral respiratory illness.  I will order a COVID and flu PCR along with a strep PCR.  Strep PCR is negative.  Respiratory panel is negative for COVID and influenza.  I will discharge patient on the diagnosis of viral URI with a cough with a prescription for Atrovent nasal spray to open the nasal congestion along with Tessalon Perles and Promethazine DM cough syrup for cough and congestion.  He can use over-the-counter Tylenol and/or ibuprofen according the package instructions as needed for any fever or pain.  Return precautions reviewed.  Work note provided.   Final Clinical Impressions(s) / UC Diagnoses   Final diagnoses:  Viral URI with cough     Discharge Instructions      Your testing today was negative for COVID, influenza, and strep.  I do believe that you have a viral respiratory illness which is causing your symptoms.  Please use over-the-counter Tylenol and/or ibuprofen according to the package instructions.  Use the Atrovent nasal spray, 2 squirts in each nostril every 6 hours, as needed for runny nose and postnasal drip.  Use the Tessalon Perles every 8 hours during the day.  Take them with a small sip of water.  They may give you some numbness to the base of your tongue or a metallic taste in your mouth, this is normal.  Use the Promethazine DM cough syrup at bedtime for cough and congestion.  It will make you drowsy so do not take it during the day.  Return for reevaluation or see your primary care provider for any new or worsening symptoms.      ED Prescriptions     Medication Sig Dispense  Auth. Provider   benzonatate (TESSALON) 100 MG capsule Take 2 capsules (200 mg total) by mouth every 8 (eight) hours. 21 capsule Becky Augusta, NP   ipratropium (ATROVENT) 0.06 % nasal spray Place 2 sprays into both nostrils 4 (four) times daily. 15 mL Becky Augusta, NP   promethazine-dextromethorphan (PROMETHAZINE-DM) 6.25-15 MG/5ML syrup Take 5 mLs by mouth 4 (four) times daily as needed. 118 mL Becky Augusta, NP      PDMP not reviewed this encounter.   Becky Augusta, NP 06/11/23 1016

## 2023-06-11 NOTE — Discharge Instructions (Signed)
 Your testing today was negative for COVID, influenza, and strep.  I do believe that you have a viral respiratory illness which is causing your symptoms.  Please use over-the-counter Tylenol and/or ibuprofen according to the package instructions.  Use the Atrovent nasal spray, 2 squirts in each nostril every 6 hours, as needed for runny nose and postnasal drip.  Use the Tessalon Perles every 8 hours during the day.  Take them with a small sip of water.  They may give you some numbness to the base of your tongue or a metallic taste in your mouth, this is normal.  Use the Promethazine DM cough syrup at bedtime for cough and congestion.  It will make you drowsy so do not take it during the day.  Return for reevaluation or see your primary care provider for any new or worsening symptoms.

## 2023-07-28 ENCOUNTER — Other Ambulatory Visit: Payer: Self-pay

## 2023-08-17 ENCOUNTER — Ambulatory Visit: Payer: Self-pay

## 2023-08-17 NOTE — Progress Notes (Signed)
 Reported and observed a large abrasion to right mid lateral side of leg after stating he slid into second base while playing ball on Sunday.  He has been keeping it clean and dry.  Area cleansed with saline and covered with Vaseline gauze and dry dressing.  Noted some edema bilateral lower legs and reports this is usual for him.  Education about keeping legs elevated and avoiding too much salt.  States he eats good protein and fluids. He will follow up with provider if any problems or sign of infection.  Area of large abrasion appears with scabbed area in large middle with small circumference with red beefy granulation.  Small abrasion above large one noted with yellow slough and area cleansed and bandaid changed with instruction to change more frequently at least twice daily instead of once if drainage and keep clean.

## 2024-05-17 ENCOUNTER — Ambulatory Visit: Payer: Self-pay

## 2024-05-17 DIAGNOSIS — Z0000 Encounter for general adult medical examination without abnormal findings: Secondary | ICD-10-CM

## 2024-05-17 NOTE — Progress Notes (Signed)
Nurse Physical: Labs & U/A

## 2024-05-18 LAB — CMP12+LP+TP+TSH+6AC+CBC/D/PLT
ALT: 24 [IU]/L (ref 0–44)
AST: 25 [IU]/L (ref 0–40)
Albumin: 4.2 g/dL (ref 4.1–5.1)
Alkaline Phosphatase: 102 [IU]/L (ref 47–123)
BUN/Creatinine Ratio: 11 (ref 9–20)
BUN: 13 mg/dL (ref 6–20)
Basophils Absolute: 0.1 10*3/uL (ref 0.0–0.2)
Basos: 1 %
Bilirubin Total: 0.6 mg/dL (ref 0.0–1.2)
Calcium: 9.1 mg/dL (ref 8.7–10.2)
Chloride: 100 mmol/L (ref 96–106)
Chol/HDL Ratio: 3.6 ratio (ref 0.0–5.0)
Cholesterol, Total: 160 mg/dL (ref 100–199)
Creatinine, Ser: 1.15 mg/dL (ref 0.76–1.27)
EOS (ABSOLUTE): 0.3 10*3/uL (ref 0.0–0.4)
Eos: 3 %
Estimated CHD Risk: 0.6 times avg. (ref 0.0–1.0)
Free Thyroxine Index: 1.9 (ref 1.2–4.9)
GGT: 44 [IU]/L (ref 0–65)
Globulin, Total: 2.8 g/dL (ref 1.5–4.5)
Glucose: 132 mg/dL — ABNORMAL HIGH (ref 70–99)
HDL: 45 mg/dL
Hematocrit: 49 % (ref 37.5–51.0)
Hemoglobin: 15.9 g/dL (ref 13.0–17.7)
Immature Grans (Abs): 0.2 10*3/uL — ABNORMAL HIGH (ref 0.0–0.1)
Immature Granulocytes: 2 %
Iron: 82 ug/dL (ref 38–169)
LDH: 218 [IU]/L (ref 121–224)
LDL Chol Calc (NIH): 96 mg/dL (ref 0–99)
Lymphocytes Absolute: 2.4 10*3/uL (ref 0.7–3.1)
Lymphs: 25 %
MCH: 26.9 pg (ref 26.6–33.0)
MCHC: 32.4 g/dL (ref 31.5–35.7)
MCV: 83 fL (ref 79–97)
Monocytes Absolute: 0.8 10*3/uL (ref 0.1–0.9)
Monocytes: 8 %
Neutrophils Absolute: 5.8 10*3/uL (ref 1.4–7.0)
Neutrophils: 61 %
Phosphorus: 3.6 mg/dL (ref 2.8–4.1)
Platelets: 289 10*3/uL (ref 150–450)
Potassium: 4.7 mmol/L (ref 3.5–5.2)
RBC: 5.92 x10E6/uL — ABNORMAL HIGH (ref 4.14–5.80)
RDW: 13.7 % (ref 11.6–15.4)
Sodium: 137 mmol/L (ref 134–144)
T3 Uptake Ratio: 27 % (ref 24–39)
T4, Total: 7.2 ug/dL (ref 4.5–12.0)
TSH: 3.23 u[IU]/mL (ref 0.450–4.500)
Total Protein: 7 g/dL (ref 6.0–8.5)
Triglycerides: 106 mg/dL (ref 0–149)
Uric Acid: 7.6 mg/dL (ref 3.8–8.4)
VLDL Cholesterol Cal: 19 mg/dL (ref 5–40)
WBC: 9.6 10*3/uL (ref 3.4–10.8)
eGFR: 87 mL/min/{1.73_m2}

## 2024-05-24 ENCOUNTER — Encounter: Admitting: Physician Assistant

## 2024-05-31 ENCOUNTER — Encounter: Admitting: Physician Assistant
